# Patient Record
Sex: Female | Born: 1970 | Race: White | Hispanic: No | Marital: Married | Smoking: Current every day smoker
Health system: Southern US, Community
[De-identification: ages and names within clinical notes are randomized; demographics above are authoritative.]

## PROBLEM LIST (undated history)

## (undated) DIAGNOSIS — K760 Fatty (change of) liver, not elsewhere classified: Secondary | ICD-10-CM

## (undated) DIAGNOSIS — E119 Type 2 diabetes mellitus without complications: Secondary | ICD-10-CM

## (undated) DIAGNOSIS — E669 Obesity, unspecified: Secondary | ICD-10-CM

## (undated) DIAGNOSIS — Z720 Tobacco use: Secondary | ICD-10-CM

## (undated) DIAGNOSIS — N12 Tubulo-interstitial nephritis, not specified as acute or chronic: Secondary | ICD-10-CM

## (undated) HISTORY — PX: JOINT REPLACEMENT: SHX530

## (undated) HISTORY — PX: CHOLECYSTECTOMY: SHX55

---

## 2016-01-15 ENCOUNTER — Encounter (HOSPITAL_BASED_OUTPATIENT_CLINIC_OR_DEPARTMENT_OTHER): Payer: Self-pay

## 2016-01-15 DIAGNOSIS — L02415 Cutaneous abscess of right lower limb: Secondary | ICD-10-CM | POA: Diagnosis present

## 2016-01-15 DIAGNOSIS — R002 Palpitations: Secondary | ICD-10-CM | POA: Diagnosis not present

## 2016-01-15 DIAGNOSIS — E119 Type 2 diabetes mellitus without complications: Secondary | ICD-10-CM | POA: Diagnosis not present

## 2016-01-15 NOTE — ED Triage Notes (Signed)
Pt has what looks like an abscess to the inner right thigh, although she insists it is a spider bite despite never seeing a spider or feeling a bite

## 2016-01-16 ENCOUNTER — Emergency Department (HOSPITAL_BASED_OUTPATIENT_CLINIC_OR_DEPARTMENT_OTHER)
Admission: EM | Admit: 2016-01-16 | Discharge: 2016-01-16 | Disposition: A | Discharge status: Home / Self Care | Payer: Managed Care, Other (non HMO) | Attending: Emergency Medicine | Admitting: Emergency Medicine

## 2016-01-16 DIAGNOSIS — L02415 Cutaneous abscess of right lower limb: Secondary | ICD-10-CM | POA: Diagnosis not present

## 2016-01-16 DIAGNOSIS — R002 Palpitations: Secondary | ICD-10-CM

## 2016-01-16 HISTORY — DX: Type 2 diabetes mellitus without complications: E11.9

## 2016-01-16 LAB — PREGNANCY, URINE: Preg Test, Ur: NEGATIVE

## 2016-01-16 MED ORDER — DOXYCYCLINE HYCLATE 100 MG PO CAPS: 100.0000 mg | ORAL_CAPSULE | Freq: Two times a day (BID) | ORAL | 0 refills | Status: DC

## 2016-01-16 MED ORDER — DOXYCYCLINE HYCLATE 100 MG PO TABS
100.0000 mg | ORAL_TABLET | Freq: Once | ORAL | Status: AC
  Administered 2016-01-16: 100 mg via ORAL
  Filled 2016-01-16: qty 1

## 2016-01-16 NOTE — ED Provider Notes (Signed)
[facility] Provider Note: Lowella Dell, MD, FACEP  CSN: [id number] MRN: [id number] ARRIVAL: 01/15/16 at 2346   CHIEF COMPLAINT  Abscess   HISTORY OF PRESENT ILLNESS  Natalie Brooks is a 45 y.o. female with a tender, erythematous lesion to the right medial thigh that began about 3 days ago. She believes it to be a spider bite but never saw a spider nor did she feel a bite. There has not been drainage from it. There is mild-to-moderate tenderness.  She has also been having palpitations by which she means a rapid pounding heartbeat. Her current episode began about 5 days ago after an argument with her husband. It is been constant and persistent. She denies chest pain or shortness of breath.   Past Medical History:  Diagnosis Date  . Diabetes mellitus without complication [facility])     Past Surgical History:  Procedure Laterality Date  . CHOLECYSTECTOMY    . JOINT REPLACEMENT      No family history on file.  Social History  Substance Use Topics  . Smoking status: Not on file  . Smokeless tobacco: Not on file  . Alcohol use Not on file    Prior to Admission medications   Not on File    Allergies Penicillins   REVIEW OF SYSTEMS  Negative except as noted here or in the History of Present Illness.   PHYSICAL EXAMINATION  Initial Vital Signs Blood pressure 127/81, pulse 88, temperature 98.1 F (36.7 C), temperature source Oral, resp. rate 18, height 6' (1.829 m), weight 240 lb (108.9 kg), last menstrual period 11/11/2015, SpO2 97 %.  Examination General: Well-developed, well-nourished female in no acute distress; appearance consistent with age of record HENT: normocephalic; atraumatic Eyes: pupils equal, round and reactive to light; extraocular muscles intact Neck: supple Heart: regular rate and rhythm; no murmur Lungs: clear to auscultation bilaterally Abdomen: soft; nondistended; nontender; bowel sounds present Extremities: No deformity; full  range of motion; pulses normal Neurologic: Awake, alert and oriented; motor function intact in all extremities and symmetric; no facial droop Skin: Warm and dry; tender erythematous lesion right medial thigh consistent with early abscess, no pus pocket seen on bedside ultrasound Psychiatric: Normal mood and affect   RESULTS  Summary of this visit's results, reviewed by myself:   EKG Interpretation  Date/Time:  Friday January 16 2016 03:21:02 EDT Ventricular Rate:  69 PR Interval:    QRS Duration: 96 QT Interval:  438 QTC Calculation: 470 R Axis:   10 Text Interpretation:  Sinus rhythm Low voltage, precordial leads Artifact in V2 No previous ECGs available Confirmed by Kris Burd  MD, Jonny Ruiz ([id number]) on 01/16/2016 3:25:51 AM      Laboratory Studies: Results for orders placed or performed during the hospital encounter of 01/16/16 (from the past 24 hour(s))  Pregnancy, urine     Status: None   Collection Time: 01/16/16  4:13 AM  Result Value Ref Range   Preg Test, Ur NEGATIVE NEGATIVE   Imaging Studies: No results found.  ED COURSE  Nursing notes and initial vitals signs, including pulse oximetry, reviewed.  Vitals:   01/15/16 2355  BP: 127/81  Pulse: 88  Resp: 18  Temp: 98.1 F (36.7 C)  TempSrc: Oral  SpO2: 97%  Weight: 240 lb (108.9 kg)  Height: 6' (1.829 m)   Lesion appears to be an abscess but there is no pus pocket palpable or seen bedside ultrasound. We will treat with antibiotics. Despite the patient's complaint of the sensation of  a rapid pounding heartbeat her rhythm is normal sinus with an unremarkable EKG.  PROCEDURES    ED DIAGNOSES     ICD-9-CM ICD-10-CM   1. Abscess of right thigh 682.6 L02.415        Paula LibraJohn Robertta Halfhill, MD 01/16/16 [id number]

## 2016-02-14 ENCOUNTER — Emergency Department (HOSPITAL_BASED_OUTPATIENT_CLINIC_OR_DEPARTMENT_OTHER): Payer: Managed Care, Other (non HMO)

## 2016-02-14 ENCOUNTER — Emergency Department (HOSPITAL_BASED_OUTPATIENT_CLINIC_OR_DEPARTMENT_OTHER)
Admission: EM | Admit: 2016-02-14 | Discharge: 2016-02-14 | Disposition: A | Discharge status: Home / Self Care | Payer: Managed Care, Other (non HMO) | Attending: Emergency Medicine | Admitting: Emergency Medicine

## 2016-02-14 ENCOUNTER — Encounter (HOSPITAL_BASED_OUTPATIENT_CLINIC_OR_DEPARTMENT_OTHER): Payer: Self-pay | Admitting: Emergency Medicine

## 2016-02-14 DIAGNOSIS — R0789 Other chest pain: Secondary | ICD-10-CM | POA: Diagnosis not present

## 2016-02-14 DIAGNOSIS — Z9660 Presence of unspecified orthopedic joint implant: Secondary | ICD-10-CM | POA: Insufficient documentation

## 2016-02-14 DIAGNOSIS — R072 Precordial pain: Secondary | ICD-10-CM | POA: Diagnosis present

## 2016-02-14 DIAGNOSIS — F17210 Nicotine dependence, cigarettes, uncomplicated: Secondary | ICD-10-CM | POA: Insufficient documentation

## 2016-02-14 DIAGNOSIS — E119 Type 2 diabetes mellitus without complications: Secondary | ICD-10-CM | POA: Diagnosis not present

## 2016-02-14 LAB — CBC WITH DIFFERENTIAL/PLATELET
BASOS PCT: 0 %
Basophils Absolute: 0 10*3/uL (ref 0.0–0.1)
EOS ABS: 0.1 10*3/uL (ref 0.0–0.7)
Eosinophils Relative: 1 %
HEMATOCRIT: 39.3 % (ref 36.0–46.0)
HEMOGLOBIN: 13.3 g/dL (ref 12.0–15.0)
LYMPHS ABS: 2.9 10*3/uL (ref 0.7–4.0)
Lymphocytes Relative: 37 %
MCH: 30.6 pg (ref 26.0–34.0)
MCHC: 33.8 g/dL (ref 30.0–36.0)
MCV: 90.6 fL (ref 78.0–100.0)
MONOS PCT: 7 %
Monocytes Absolute: 0.6 10*3/uL (ref 0.1–1.0)
NEUTROS ABS: 4.3 10*3/uL (ref 1.7–7.7)
NEUTROS PCT: 55 %
Platelets: 231 10*3/uL (ref 150–400)
RBC: 4.34 MIL/uL (ref 3.87–5.11)
RDW: 13.2 % (ref 11.5–15.5)
WBC: 7.8 10*3/uL (ref 4.0–10.5)

## 2016-02-14 LAB — BASIC METABOLIC PANEL
Anion gap: 7 (ref 5–15)
BUN: 19 mg/dL (ref 6–20)
CHLORIDE: 103 mmol/L (ref 101–111)
CO2: 26 mmol/L (ref 22–32)
Calcium: 8.7 mg/dL — ABNORMAL LOW (ref 8.9–10.3)
Creatinine, Ser: 0.68 mg/dL (ref 0.44–1.00)
GFR calc non Af Amer: >60 mL/min (ref >60)
Glucose, Bld: 300 mg/dL — ABNORMAL HIGH (ref 65–99)
POTASSIUM: 3.7 mmol/L (ref 3.5–5.1)
SODIUM: 136 mmol/L (ref 135–145)

## 2016-02-14 LAB — D-DIMER, QUANTITATIVE: D-Dimer, Quant: <0.27 ug/mL-FEU (ref 0.00–0.50)

## 2016-02-14 LAB — TROPONIN I: Troponin I: <0.03 ng/mL (ref <0.03)

## 2016-02-14 MED ORDER — KETOROLAC TROMETHAMINE 15 MG/ML IJ SOLN
15.0000 mg | Freq: Once | INTRAMUSCULAR | Status: AC
  Administered 2016-02-14: 15 mg via INTRAVENOUS
  Filled 2016-02-14: qty 1

## 2016-02-14 MED ORDER — ASPIRIN 81 MG PO CHEW
324.0000 mg | CHEWABLE_TABLET | Freq: Once | ORAL | Status: AC
  Administered 2016-02-14: 324 mg via ORAL
  Filled 2016-02-14: qty 4

## 2016-02-14 NOTE — ED Triage Notes (Signed)
Patient states she woke up suddenly at 0115 and states she had pain to her central chest, radiating to the underside of her left breast, also having pain to her left shoulder and down into her hand. Patient states she has never ad this pain before. Patient reports she took two 200mg  Advil at @0120  and has not had any relief. Patient states at this time the pain is "not as bad as it was". Patient describes the pain in her arm as throbbing, and the chest pain is "stabbing" in nature. Patient denies any injuries or recollection of events that could have contributed to her pain. Patient is awake, alert, and NAD noted.

## 2016-02-14 NOTE — ED Provider Notes (Signed)
[facility] Provider Note: Lowella DellJ. Lane Lachanda Buczek, MD, FACEP  CSN: [id number] MRN: [id number] ARRIVAL: 02/14/16 at 0303 ROOM: [facility]01/[facility]01   CHIEF COMPLAINT  Chest Pain   HISTORY OF PRESENT ILLNESS  Natalie Brooks is a 45 y.o. female with history of diabetes. She is here with chest pain that awakened her from sleep about 1:15 this morning. She describes the pain is sharp, well localized and located to the left of the upper sternum. The pain is worse with deep breathing or with palpation. She is also having pain down her left arm which she describes is on the "inside" of the arm (about the T1 dermatome) with tingling in her fingers; this pain does not change with deep breathing or palpation of the chest, Nor does it change with movement of her neck. She denies shortness of breath, diaphoresis or nausea. She has had chronic mild edema of lower legs for several months which has not acutely changed.    Past Medical History:  Diagnosis Date  . Diabetes mellitus without complication [facility](HCC)     Past Surgical History:  Procedure Laterality Date  . CHOLECYSTECTOMY    . JOINT REPLACEMENT      History reviewed. No pertinent family history.  Social History  Substance Use Topics  . Smoking status: Current Every Day Smoker    Packs/day: 1.00    Types: Cigarettes  . Smokeless tobacco: Never Used  . Alcohol use No    Prior to Admission medications   Not on File    Allergies Penicillins   REVIEW OF SYSTEMS  Negative except as noted here or in the History of Present Illness.   PHYSICAL EXAMINATION  Initial Vital Signs Blood pressure 130/63, pulse 76, temperature 97.6 F (36.4 C), temperature source Oral, resp. rate 13, height 6' (1.829 m), weight 240 lb (108.9 kg), last menstrual period 11/11/2015, SpO2 97 %.  Examination General: Well-developed, well-nourished female in no acute distress; appearance consistent with age of record HENT: normocephalic; atraumatic Eyes:  pupils equal, round and reactive to light; extraocular muscles intact Neck: supple Heart: regular rate and rhythm; no murmur; occasional PACs Lungs: clear to auscultation bilaterally Chest: Point tenderness to the left of the upper sternum which reproduces the pain of the chief complaint Abdomen: soft; nondistended; nontender; no masses or hepatosplenomegaly; bowel sounds present Extremities: No deformity; full range of motion; pulses normal; trace edema of lower legs; no calf tenderness Neurologic: Awake, alert and oriented; motor function intact in all extremities and symmetric; no facial droop Skin: Warm and dry Psychiatric: Anxious; tearful; having difficulty answering simple questions such as "how long of your legs been swollen?", deferring to her husband to speak in her place   RESULTS  Summary of this visit's results, reviewed by myself:   EKG Interpretation  Date/Time:  Saturday February 14 2016 03:10:24 EST Ventricular Rate:  77 PR Interval:    QRS Duration: 93 QT Interval:  398 QTC Calculation: 451 R Axis:   8 Text Interpretation:  Sinus rhythm Atrial premature complex PACs not seen previously, otherwise No significant change was found Confirmed by Tudor Chandley  MD, Jonny RuizJOHN ([id number]) on 02/14/2016 3:14:59 AM      Laboratory Studies: Results for orders placed or performed during the hospital encounter of 02/14/16 (from the past 24 hour(s))  [facility], quantitative (not [facility])     Status: None   Collection Time: 02/14/16  3:40 AM  Result Value Ref Range   D-Dimer, Quant <0.27 0.00 - 0.50 ug/mL-FEU  Troponin I  Status: None   Collection Time: 02/14/16  3:40 AM  Result Value Ref Range   Troponin I <0.03 <0.03 ng/mL  CBC with Differential/Platelet     Status: None   Collection Time: 02/14/16  3:40 AM  Result Value Ref Range   WBC 7.8 4.0 - 10.5 K/uL   RBC 4.34 3.87 - 5.11 MIL/uL   Hemoglobin 13.3 12.0 - 15.0 g/dL   HCT 74.239.3 59.536.0 - 63.846.0 %   MCV 90.6 78.0 - 100.0 fL   MCH  30.6 26.0 - 34.0 pg   MCHC 33.8 30.0 - 36.0 g/dL   RDW 75.613.2 43.311.5 - 29.515.5 %   Platelets 231 150 - 400 K/uL   Neutrophils Relative % 55 %   Neutro Abs 4.3 1.7 - 7.7 K/uL   Lymphocytes Relative 37 %   Lymphs Abs 2.9 0.7 - 4.0 K/uL   Monocytes Relative 7 %   Monocytes Absolute 0.6 0.1 - 1.0 K/uL   Eosinophils Relative 1 %   Eosinophils Absolute 0.1 0.0 - 0.7 K/uL   Basophils Relative 0 %   Basophils Absolute 0.0 0.0 - 0.1 K/uL  Basic metabolic panel     Status: Abnormal   Collection Time: 02/14/16  3:40 AM  Result Value Ref Range   Sodium 136 135 - 145 mmol/L   Potassium 3.7 3.5 - 5.1 mmol/L   Chloride 103 101 - 111 mmol/L   CO2 26 22 - 32 mmol/L   Glucose, Bld 300 (H) 65 - 99 mg/dL   BUN 19 6 - 20 mg/dL   Creatinine, Ser 1.880.68 0.44 - 1.00 mg/dL   Calcium 8.7 (L) 8.9 - 10.3 mg/dL   GFR calc non Af Amer >60 >60 mL/min   GFR calc Af Amer >60 >60 mL/min   Anion gap 7 5 - 15   Imaging Studies: Dg Chest 2 View  Result Date: 02/14/2016 CLINICAL DATA:  Chest pressure and shortness of breath EXAM: CHEST  2 VIEW COMPARISON:  None. FINDINGS: Cardiomediastinal contours are normal. No pneumothorax or pleural effusion. No focal airspace consolidation or pulmonary edema. IMPRESSION: Clear lungs. Electronically Signed   By: Deatra RobinsonKevin  Herman M.D.   On: 02/14/2016 04:46    ED COURSE  Nursing notes and initial vitals signs, including pulse oximetry, reviewed.  Vitals:   02/14/16 0320 02/14/16 0353 02/14/16 0400 02/14/16 0431  BP:  113/65 115/64 116/65  Pulse:  72 81 73  Resp:  16 19 (!) 9  Temp:      TempSrc:      SpO2:  98% 95% 97%  Weight: 240 lb (108.9 kg)     Height: 6' (1.829 m)      5:15 AM Chest and arm pain resolved after Toradol IV. Her symptoms and exam are not consistent with cardiac heart pain. Her d-dimer is negative; PERC is <2%, Well's is 0. She was advised to treat her chest wall pain with Aleve, and to return for worsening or changing symptoms.  PROCEDURES    ED  DIAGNOSES     ICD-9-CM ICD-10-CM   1. Chest wall pain 786.52 R07.89        Paula LibraJohn Izeah Vossler, MD 02/14/16 [phone]

## 2016-02-14 NOTE — ED Notes (Signed)
ED Provider at bedside. 

## 2016-02-14 NOTE — ED Notes (Addendum)
Patient now reports that for the past couple of months she has had swelling and pain to her bilateral calves. Patient states this is not something that has changed in the past day or two. Patient is tearful. Patient states the pain in her chest is similiar to that of the pain when pressure is applied to her chest. Patient states applying pressure to her chest does not worsen her SOB, nor does it worsen the pain in her left arm. Patient also reports to MD that she has stiffness in her neck as well, that is also not new with the chest pain this morning.   Edema noted to BLE, non pitting.

## 2016-02-14 NOTE — ED Notes (Signed)
Patient returned from XR. 

## 2016-05-18 ENCOUNTER — Encounter (HOSPITAL_BASED_OUTPATIENT_CLINIC_OR_DEPARTMENT_OTHER): Payer: Self-pay | Admitting: Emergency Medicine

## 2016-05-18 ENCOUNTER — Emergency Department (HOSPITAL_BASED_OUTPATIENT_CLINIC_OR_DEPARTMENT_OTHER): Payer: Managed Care, Other (non HMO)

## 2016-05-18 ENCOUNTER — Emergency Department (HOSPITAL_BASED_OUTPATIENT_CLINIC_OR_DEPARTMENT_OTHER)
Admission: EM | Admit: 2016-05-18 | Discharge: 2016-05-18 | Disposition: A | Discharge status: Home / Self Care | Payer: Managed Care, Other (non HMO) | Attending: Emergency Medicine | Admitting: Emergency Medicine

## 2016-05-18 DIAGNOSIS — K760 Fatty (change of) liver, not elsewhere classified: Secondary | ICD-10-CM | POA: Insufficient documentation

## 2016-05-18 DIAGNOSIS — N3000 Acute cystitis without hematuria: Secondary | ICD-10-CM | POA: Insufficient documentation

## 2016-05-18 DIAGNOSIS — F17210 Nicotine dependence, cigarettes, uncomplicated: Secondary | ICD-10-CM | POA: Diagnosis not present

## 2016-05-18 DIAGNOSIS — E119 Type 2 diabetes mellitus without complications: Secondary | ICD-10-CM | POA: Insufficient documentation

## 2016-05-18 DIAGNOSIS — R1011 Right upper quadrant pain: Secondary | ICD-10-CM | POA: Diagnosis present

## 2016-05-18 DIAGNOSIS — R109 Unspecified abdominal pain: Secondary | ICD-10-CM

## 2016-05-18 LAB — CBC
HEMATOCRIT: 42.9 % (ref 36.0–46.0)
HEMOGLOBIN: 14.5 g/dL (ref 12.0–15.0)
MCH: 30.3 pg (ref 26.0–34.0)
MCHC: 33.8 g/dL (ref 30.0–36.0)
MCV: 89.6 fL (ref 78.0–100.0)
Platelets: 243 10*3/uL (ref 150–400)
RBC: 4.79 MIL/uL (ref 3.87–5.11)
RDW: 12.7 % (ref 11.5–15.5)
WBC: 7.8 10*3/uL (ref 4.0–10.5)

## 2016-05-18 LAB — HEPATIC FUNCTION PANEL
ALBUMIN: 3.8 g/dL (ref 3.5–5.0)
ALK PHOS: 77 U/L (ref 38–126)
ALT: 24 U/L (ref 14–54)
AST: 19 U/L (ref 15–41)
BILIRUBIN TOTAL: 0.5 mg/dL (ref 0.3–1.2)
Total Protein: 7.3 g/dL (ref 6.5–8.1)

## 2016-05-18 LAB — BASIC METABOLIC PANEL
Anion gap: 8 (ref 5–15)
BUN: 15 mg/dL (ref 6–20)
CHLORIDE: 105 mmol/L (ref 101–111)
CO2: 23 mmol/L (ref 22–32)
Calcium: 8.9 mg/dL (ref 8.9–10.3)
Creatinine, Ser: 0.88 mg/dL (ref 0.44–1.00)
GFR calc Af Amer: >60 mL/min (ref >60)
Glucose, Bld: 334 mg/dL — ABNORMAL HIGH (ref 65–99)
POTASSIUM: 3.8 mmol/L (ref 3.5–5.1)
SODIUM: 136 mmol/L (ref 135–145)

## 2016-05-18 LAB — TROPONIN I: Troponin I: <0.03 ng/mL (ref <0.03)

## 2016-05-18 LAB — URINALYSIS, MICROSCOPIC (REFLEX)

## 2016-05-18 LAB — URINALYSIS, ROUTINE W REFLEX MICROSCOPIC
BILIRUBIN URINE: NEGATIVE
Glucose, UA: >=500 mg/dL — AB
HGB URINE DIPSTICK: NEGATIVE
Ketones, ur: NEGATIVE mg/dL
Leukocytes, UA: NEGATIVE
NITRITE: NEGATIVE
Protein, ur: NEGATIVE mg/dL
SPECIFIC GRAVITY, URINE: 1.04 — AB (ref 1.005–1.030)
pH: 5 (ref 5.0–8.0)

## 2016-05-18 LAB — LIPASE, BLOOD: LIPASE: 23 U/L (ref 11–51)

## 2016-05-18 LAB — PREGNANCY, URINE: PREG TEST UR: NEGATIVE

## 2016-05-18 MED ORDER — OXYCODONE HCL 5 MG PO TABS: 5.0000 mg | ORAL_TABLET | ORAL | 0 refills | Status: DC | PRN

## 2016-05-18 MED ORDER — LEVOFLOXACIN 750 MG PO TABS: 750.0000 mg | ORAL_TABLET | Freq: Every day | ORAL | 0 refills | Status: DC

## 2016-05-18 MED ORDER — PROMETHAZINE HCL 25 MG PO TABS: 25.0000 mg | ORAL_TABLET | Freq: Four times a day (QID) | ORAL | 0 refills | Status: DC | PRN

## 2016-05-18 MED ORDER — KETOROLAC TROMETHAMINE 15 MG/ML IJ SOLN
15.0000 mg | Freq: Once | INTRAMUSCULAR | Status: AC
  Administered 2016-05-18: 15 mg via INTRAVENOUS
  Filled 2016-05-18: qty 1

## 2016-05-18 MED ORDER — IOPAMIDOL (ISOVUE-300) INJECTION 61%
100.0000 mL | Freq: Once | INTRAVENOUS | Status: AC | PRN
  Administered 2016-05-18: 100 mL via INTRAVENOUS

## 2016-05-18 MED ORDER — MORPHINE SULFATE (PF) 4 MG/ML IV SOLN
4.0000 mg | Freq: Once | INTRAVENOUS | Status: AC
  Administered 2016-05-18: 4 mg via INTRAVENOUS
  Filled 2016-05-18: qty 1

## 2016-05-18 MED ORDER — HYDROMORPHONE HCL 1 MG/ML IJ SOLN
1.0000 mg | Freq: Once | INTRAMUSCULAR | Status: AC
  Administered 2016-05-18: 1 mg via INTRAVENOUS
  Filled 2016-05-18: qty 1

## 2016-05-18 MED ORDER — SODIUM CHLORIDE 0.9 % IV BOLUS (SEPSIS)
1000.0000 mL | Freq: Once | INTRAVENOUS | Status: AC
  Administered 2016-05-18: 1000 mL via INTRAVENOUS

## 2016-05-18 MED ORDER — ONDANSETRON HCL 4 MG/2ML IJ SOLN
4.0000 mg | Freq: Once | INTRAMUSCULAR | Status: AC
  Administered 2016-05-18: 4 mg via INTRAVENOUS
  Filled 2016-05-18: qty 2

## 2016-05-18 NOTE — ED Triage Notes (Signed)
Pt c/o pain under R rib cage and RUQ of abd. Denies fever, n/v/d, or other symptoms. Rates pain 7/10.

## 2016-05-18 NOTE — ED Provider Notes (Signed)
[facility] Note   CSN: [id number] Arrival date & time: 05/18/16  0522     History   Chief Complaint Chief Complaint  Patient presents with  . Chest Pain    HPI Natalie Brooks is a 46 y.o. female.  HPI 46 year old female with history of morbid obesity presents with right flank pain. The patient states over the last several weeks, she has had initially intermittent and now constant right-sided upper abdominal pain. The pain is an aching, throbbing, cramping sensation that is worse with movement and palpation. It is also mildly worse with eating. She's been taking ibuprofen for this with minimal improvement. She has a history of gallbladder surgery and states her pain was similar to this. No cough or sputum production. Denies any shortness of breath. No nausea or diaphoresis.  Past Medical History:  Diagnosis Date  . Diabetes mellitus without complication (HCC)     There are no active problems to display for this patient.   Past Surgical History:  Procedure Laterality Date  . CHOLECYSTECTOMY    . JOINT REPLACEMENT      OB History    No data available       Home Medications    Prior to Admission medications   Medication Sig Start Date End Date Taking? Authorizing Provider  levofloxacin (LEVAQUIN) 750 MG tablet Take 1 tablet (750 mg total) by mouth daily. 05/18/16 05/25/16  Shaune Pollack, MD  metFORMIN (GLUCOPHAGE) 500 MG tablet Take 500 mg by mouth 2 (two) times daily with a meal.    Historical Provider, MD  oxyCODONE (ROXICODONE) 5 MG immediate release tablet Take 1 tablet (5 mg total) by mouth every 4 (four) hours as needed for severe pain. 05/18/16   Shaune Pollack, MD  promethazine (PHENERGAN) 25 MG tablet Take 1 tablet (25 mg total) by mouth every 6 (six) hours as needed for nausea or vomiting. 05/18/16   Shaune Pollack, MD    Family History No family history on file.  Social History Social History  Substance Use Topics  . Smoking  status: Current Every Day Smoker    Packs/day: 1.00    Types: Cigarettes  . Smokeless tobacco: Never Used  . Alcohol use No     Allergies   Penicillins   Review of Systems Review of Systems  Constitutional: Negative for chills, fatigue and fever.  HENT: Negative for congestion and rhinorrhea.   Eyes: Negative for visual disturbance.  Respiratory: Negative for cough, shortness of breath and wheezing.   Cardiovascular: Negative for chest pain and leg swelling.  Gastrointestinal: Positive for abdominal pain and nausea. Negative for diarrhea and vomiting.  Genitourinary: Negative for dysuria, flank pain, hematuria, vaginal bleeding and vaginal discharge.  Musculoskeletal: Negative for neck pain and neck stiffness.  Skin: Negative for rash and wound.  Allergic/Immunologic: Negative for immunocompromised state.  Neurological: Negative for syncope, weakness and headaches.  All other systems reviewed and are negative.    Physical Exam Updated Vital Signs BP 117/57 (BP Location: Right Arm)   Pulse 70   Temp 97.9 F (36.6 C) (Oral)   Resp 16   Ht 5\' 11"  (1.803 m)   Wt 240 lb (108.9 kg)   LMP 02/11/2016   SpO2 100%   BMI 33.47 kg/m   Physical Exam  Constitutional: She is oriented to person, place, and time. She appears well-developed and well-nourished. No distress.  HENT:  Head: Normocephalic and atraumatic.  Eyes: Conjunctivae are normal.  Neck: Neck supple.  Cardiovascular: Normal rate,  regular rhythm and normal heart sounds.  Exam reveals no friction rub.   No murmur heard. Pulmonary/Chest: Effort normal and breath sounds normal. No respiratory distress. She has no wheezes. She has no rales.  Abdominal: Soft. Normal appearance. She exhibits no distension. There is no rigidity, no rebound, no guarding and no CVA tenderness.    Musculoskeletal: She exhibits no edema.  Neurological: She is alert and oriented to person, place, and time. She exhibits normal muscle tone.    Skin: Skin is warm. Capillary refill takes less than 2 seconds.  Psychiatric: She has a normal mood and affect.  Nursing note and vitals reviewed.    ED Treatments / Results  Labs (all labs ordered are listed, but only abnormal results are displayed) Labs Reviewed  URINALYSIS, ROUTINE W REFLEX MICROSCOPIC - Abnormal; Notable for the following:       Result Value   Specific Gravity, Urine 1.040 (*)    Glucose, UA >=500 (*)    All other components within normal limits  BASIC METABOLIC PANEL - Abnormal; Notable for the following:    Glucose, Bld 334 (*)    All other components within normal limits  URINALYSIS, MICROSCOPIC (REFLEX) - Abnormal; Notable for the following:    Bacteria, UA MANY (*)    Squamous Epithelial / LPF 0-5 (*)    All other components within normal limits  HEPATIC FUNCTION PANEL - Abnormal; Notable for the following:    Bilirubin, Direct <0.1 (*)    All other components within normal limits  URINE CULTURE  PREGNANCY, URINE  CBC  TROPONIN I  LIPASE, BLOOD  TROPONIN I    EKG  EKG Interpretation  Date/Time:  Tuesday May 18 2016 06:03:49 EST Ventricular Rate:  83 PR Interval:    QRS Duration: 93 QT Interval:  384 QTC Calculation: 452 R Axis:   1 Text Interpretation:  Sinus rhythm Probable left atrial enlargement Low voltage, precordial leads Confirmed by [facility]  MD, Morene Antu ([id number]) on 05/18/2016 6:12:38 AM Also confirmed by [facility]  MD, APRIL ([id number]), editor [place]  CCT, BEVERLY ([id number])  on 05/18/2016 6:43:24 AM       Radiology Dg Chest 2 View  Result Date: 05/18/2016 CLINICAL DATA:  Chest pain EXAM: CHEST  2 VIEW COMPARISON:  Chest radiograph 02/14/2016 FINDINGS: The lungs are well inflated. Cardiomediastinal contours are normal. No pneumothorax or pleural effusion. No focal airspace consolidation or pulmonary edema. No rib fracture visualized. IMPRESSION: Clear lungs. Electronically Signed   By: Deatra Robinson M.D.   On: 05/18/2016  06:27   Ct Abdomen Pelvis W Contrast  Result Date: 05/18/2016 CLINICAL DATA:  Pain under RIGHT rib cage and RIGHT upper quadrant for months. Worse in the past 2 weeks. EXAM: CT ABDOMEN AND PELVIS WITH CONTRAST TECHNIQUE: Multidetector CT imaging of the abdomen and pelvis was performed using the standard protocol following bolus administration of intravenous contrast. CONTRAST:  ISOVUE-300 IOPAMIDOL (ISOVUE-300) INJECTION 61% COMPARISON:  None. FINDINGS: Lower chest: No acute abnormality. Hepatobiliary: No focal liver abnormality is seen. Steatosis. Status post cholecystectomy. No biliary dilatation. Pancreas: Unremarkable. No pancreatic ductal dilatation or surrounding inflammatory changes. Spleen: Normal in size without focal abnormality. Adrenals/Urinary Tract: Adrenal glands are unremarkable. Kidneys are normal, without renal calculi, focal lesion, or hydronephrosis. Bladder is unremarkable. Stomach/Bowel: Stomach is within normal limits. Appendix appears normal. No evidence of bowel wall thickening, distention, or inflammatory changes. Vascular/Lymphatic: No significant vascular findings are present. No enlarged abdominal or pelvic lymph nodes. Reproductive: Uterus and bilateral adnexa  are unremarkable. Other: No abdominal wall hernia or abnormality. No abdominopelvic ascites. Musculoskeletal: No acute or significant osseous findings. IMPRESSION: Hepatic steatosis. Status post cholecystectomy. No acute abnormalities seen to explain the patient's reported symptoms. Electronically Signed   By: Elsie StainJohn T Curnes M.D.   On: 05/18/2016 08:31    Procedures Procedures (including critical care time)  Medications Ordered in ED Medications  sodium chloride 0.9 % bolus 1,000 mL (0 mLs Intravenous Stopped 05/18/16 1005)  HYDROmorphone (DILAUDID) injection 1 mg (1 mg Intravenous Given 05/18/16 0807)  ondansetron (ZOFRAN) injection 4 mg (4 mg Intravenous Given 05/18/16 0807)  iopamidol (ISOVUE-300) 61 %  injection 100 mL (100 mLs Intravenous Contrast Given 05/18/16 0750)  ketorolac (TORADOL) 15 MG/ML injection 15 mg (15 mg Intravenous Given 05/18/16 1016)  morphine 4 MG/ML injection 4 mg (4 mg Intravenous Given 05/18/16 1024)     Initial Impression / Assessment and Plan / ED Course  I have reviewed the triage vital signs and the nursing notes.  Pertinent labs & imaging results that were available during my care of the patient were reviewed by me and considered in my medical decision making (see chart for details).     46 year old female who presents with a 2 week history of progressively worsening right upper quadrant pain. She is status post cholecystectomy. On arrival, vital signs are stable. Examination is as above. Etiology of flank pain is unclear at this time. Differential includes musculoskeletal pain, also possible hepatitis secondary to fatty liver, versus retained stone or sludge. Given undifferentiated nature and obesity limiting exam, will obtain CT scan. Otherwise, will obtain screening lab work.  Labwork reviewed and unremarkable. CBC without leukocytosis. BMP is at baseline. LFTs are also normal. Lipase normal. Troponin negative 2. CT scan subsequently obtained and does show fatty liver but no evidence of other acute abnormality and symptoms are improved with analgesia here. Suspect pain secondary to fatty liver disease versus muscular skeletal pain. Her urinalysis does show evidence of pyuria. She does not, however, have any CVA tenderness and I do not suspect pyelonephritis. Will treat her pain, as well as give brief course of Keflex for possible UTI, and discharge home.  Final Clinical Impressions(s) / ED Diagnoses   Final diagnoses:  Flank pain  Fatty liver  Acute cystitis without hematuria    New Prescriptions Discharge Medication List as of 05/18/2016 10:40 AM    START taking these medications   Details  levofloxacin (LEVAQUIN) 750 MG tablet Take 1 tablet (750 mg  total) by mouth daily., Starting Tue 05/18/2016, Until Tue 05/25/2016, Print    oxyCODONE (ROXICODONE) 5 MG immediate release tablet Take 1 tablet (5 mg total) by mouth every 4 (four) hours as needed for severe pain., Starting Tue 05/18/2016, Print    promethazine (PHENERGAN) 25 MG tablet Take 1 tablet (25 mg total) by mouth every 6 (six) hours as needed for nausea or vomiting., Starting Tue 05/18/2016, Print         Shaune Pollackameron Vernice Bowker, MD 05/18/16 [phone]

## 2016-05-20 LAB — URINE CULTURE: Culture: >=100000 — AB

## 2016-05-21 ENCOUNTER — Telehealth (HOSPITAL_BASED_OUTPATIENT_CLINIC_OR_DEPARTMENT_OTHER): Payer: Self-pay

## 2016-05-21 NOTE — Telephone Encounter (Signed)
Post ED Visit - Positive Culture Follow-up  Culture report reviewed by antimicrobial stewardship pharmacist:  [x]  Enzo BiNathan Batchelder, Pharm.D. []  Celedonio MiyamotoJeremy Frens, Pharm.D., BCPS []  Garvin FilaMike Maccia, Pharm.D. []  Georgina PillionElizabeth Martin, Pharm.D., BCPS []  [place]-BacaviMinh Pham, [place]., BCPS, AAHIVP []  Estella HuskMichelle Turner, Pharm.D., BCPS, AAHIVP []  Tennis Mustassie Stewart, Pharm.D. []  Sherle Poeob Vincent, [place].  Positive urine culture, >/= 100,000 colonies -> Klebsiella Pneumoniae Treated with Levofloxacin, organism sensitive to the same and no further patient follow-up is required at this time.  Arvid RightClark, Natalie Brooks 05/21/2016, 12:09 PM

## 2016-05-23 ENCOUNTER — Encounter (HOSPITAL_BASED_OUTPATIENT_CLINIC_OR_DEPARTMENT_OTHER): Payer: Self-pay | Admitting: Emergency Medicine

## 2016-05-23 ENCOUNTER — Inpatient Hospital Stay (HOSPITAL_BASED_OUTPATIENT_CLINIC_OR_DEPARTMENT_OTHER)
Admission: EM | Admit: 2016-05-23 | Discharge: 2016-05-26 | DRG: 690 | Disposition: A | Discharge status: Home / Self Care | Payer: Managed Care, Other (non HMO) | Attending: Internal Medicine | Admitting: Internal Medicine

## 2016-05-23 DIAGNOSIS — E669 Obesity, unspecified: Secondary | ICD-10-CM | POA: Diagnosis present

## 2016-05-23 DIAGNOSIS — E119 Type 2 diabetes mellitus without complications: Secondary | ICD-10-CM

## 2016-05-23 DIAGNOSIS — Z7984 Long term (current) use of oral hypoglycemic drugs: Secondary | ICD-10-CM

## 2016-05-23 DIAGNOSIS — N12 Tubulo-interstitial nephritis, not specified as acute or chronic: Secondary | ICD-10-CM | POA: Diagnosis not present

## 2016-05-23 DIAGNOSIS — Z833 Family history of diabetes mellitus: Secondary | ICD-10-CM

## 2016-05-23 DIAGNOSIS — N10 Acute tubulo-interstitial nephritis: Secondary | ICD-10-CM | POA: Diagnosis not present

## 2016-05-23 DIAGNOSIS — Z79899 Other long term (current) drug therapy: Secondary | ICD-10-CM

## 2016-05-23 DIAGNOSIS — R109 Unspecified abdominal pain: Secondary | ICD-10-CM | POA: Diagnosis present

## 2016-05-23 DIAGNOSIS — Z6835 Body mass index (BMI) 35.0-35.9, adult: Secondary | ICD-10-CM

## 2016-05-23 DIAGNOSIS — F17210 Nicotine dependence, cigarettes, uncomplicated: Secondary | ICD-10-CM | POA: Diagnosis present

## 2016-05-23 DIAGNOSIS — Z880 Allergy status to penicillin: Secondary | ICD-10-CM

## 2016-05-23 DIAGNOSIS — R739 Hyperglycemia, unspecified: Secondary | ICD-10-CM

## 2016-05-23 DIAGNOSIS — B961 Klebsiella pneumoniae [K. pneumoniae] as the cause of diseases classified elsewhere: Secondary | ICD-10-CM | POA: Diagnosis present

## 2016-05-23 DIAGNOSIS — K760 Fatty (change of) liver, not elsewhere classified: Secondary | ICD-10-CM | POA: Diagnosis present

## 2016-05-23 DIAGNOSIS — F17200 Nicotine dependence, unspecified, uncomplicated: Secondary | ICD-10-CM | POA: Diagnosis present

## 2016-05-23 DIAGNOSIS — E118 Type 2 diabetes mellitus with unspecified complications: Secondary | ICD-10-CM

## 2016-05-23 DIAGNOSIS — E1165 Type 2 diabetes mellitus with hyperglycemia: Secondary | ICD-10-CM | POA: Diagnosis present

## 2016-05-23 DIAGNOSIS — Z9119 Patient's noncompliance with other medical treatment and regimen: Secondary | ICD-10-CM

## 2016-05-23 HISTORY — DX: Fatty (change of) liver, not elsewhere classified: K76.0

## 2016-05-23 HISTORY — DX: Tubulo-interstitial nephritis, not specified as acute or chronic: N12

## 2016-05-23 HISTORY — DX: Tobacco use: Z72.0

## 2016-05-23 HISTORY — DX: Obesity, unspecified: E66.9

## 2016-05-23 NOTE — ED Triage Notes (Signed)
Pt reports pain in RUQ starting a couple weeks ago.  Was seen here Tuesday and was told that liver was inflamed, having increased pain today. Pt denies v/d, but does have nausea.  Pt alert and oriented.

## 2016-05-24 ENCOUNTER — Encounter (HOSPITAL_COMMUNITY): Payer: Self-pay | Admitting: Internal Medicine

## 2016-05-24 ENCOUNTER — Emergency Department (HOSPITAL_BASED_OUTPATIENT_CLINIC_OR_DEPARTMENT_OTHER): Payer: Managed Care, Other (non HMO)

## 2016-05-24 DIAGNOSIS — N12 Tubulo-interstitial nephritis, not specified as acute or chronic: Secondary | ICD-10-CM | POA: Diagnosis not present

## 2016-05-24 DIAGNOSIS — R1010 Upper abdominal pain, unspecified: Secondary | ICD-10-CM | POA: Diagnosis not present

## 2016-05-24 DIAGNOSIS — E118 Type 2 diabetes mellitus with unspecified complications: Secondary | ICD-10-CM | POA: Diagnosis not present

## 2016-05-24 DIAGNOSIS — K760 Fatty (change of) liver, not elsewhere classified: Secondary | ICD-10-CM | POA: Diagnosis not present

## 2016-05-24 DIAGNOSIS — F17200 Nicotine dependence, unspecified, uncomplicated: Secondary | ICD-10-CM | POA: Diagnosis not present

## 2016-05-24 DIAGNOSIS — R109 Unspecified abdominal pain: Secondary | ICD-10-CM | POA: Diagnosis present

## 2016-05-24 DIAGNOSIS — E669 Obesity, unspecified: Secondary | ICD-10-CM | POA: Diagnosis present

## 2016-05-24 DIAGNOSIS — E119 Type 2 diabetes mellitus without complications: Secondary | ICD-10-CM

## 2016-05-24 LAB — TROPONIN I

## 2016-05-24 LAB — URINALYSIS, MICROSCOPIC (REFLEX)

## 2016-05-24 LAB — URINALYSIS, ROUTINE W REFLEX MICROSCOPIC
Bilirubin Urine: NEGATIVE
Hgb urine dipstick: NEGATIVE
KETONES UR: NEGATIVE mg/dL
LEUKOCYTES UA: NEGATIVE
NITRITE: POSITIVE — AB
PROTEIN: NEGATIVE mg/dL
Specific Gravity, Urine: 1.038 — ABNORMAL HIGH (ref 1.005–1.030)
pH: 5 (ref 5.0–8.0)

## 2016-05-24 LAB — PREGNANCY, URINE: PREG TEST UR: NEGATIVE

## 2016-05-24 LAB — COMPREHENSIVE METABOLIC PANEL
ALT: 33 U/L (ref 14–54)
AST: 19 U/L (ref 15–41)
Albumin: 4 g/dL (ref 3.5–5.0)
Alkaline Phosphatase: 81 U/L (ref 38–126)
Anion gap: 8 (ref 5–15)
BUN: 15 mg/dL (ref 6–20)
CHLORIDE: 102 mmol/L (ref 101–111)
CO2: 26 mmol/L (ref 22–32)
CREATININE: 0.87 mg/dL (ref 0.44–1.00)
Calcium: 9.5 mg/dL (ref 8.9–10.3)
GFR calc non Af Amer: >60 mL/min (ref >60)
Glucose, Bld: 300 mg/dL — ABNORMAL HIGH (ref 65–99)
POTASSIUM: 3.5 mmol/L (ref 3.5–5.1)
SODIUM: 136 mmol/L (ref 135–145)
Total Bilirubin: 0.3 mg/dL (ref 0.3–1.2)
Total Protein: 7.4 g/dL (ref 6.5–8.1)

## 2016-05-24 LAB — CBC WITH DIFFERENTIAL/PLATELET
BASOS ABS: 0 10*3/uL (ref 0.0–0.1)
Basophils Relative: 0 %
EOS ABS: 0.1 10*3/uL (ref 0.0–0.7)
EOS PCT: 1 %
HCT: 42.6 % (ref 36.0–46.0)
Hemoglobin: 14.9 g/dL (ref 12.0–15.0)
Lymphocytes Relative: 41 %
Lymphs Abs: 3.5 10*3/uL (ref 0.7–4.0)
MCH: 31 pg (ref 26.0–34.0)
MCHC: 35 g/dL (ref 30.0–36.0)
MCV: 88.6 fL (ref 78.0–100.0)
MONO ABS: 0.6 10*3/uL (ref 0.1–1.0)
Monocytes Relative: 7 %
Neutro Abs: 4.4 10*3/uL (ref 1.7–7.7)
Neutrophils Relative %: 51 %
PLATELETS: 287 10*3/uL (ref 150–400)
RBC: 4.81 MIL/uL (ref 3.87–5.11)
RDW: 12.9 % (ref 11.5–15.5)
WBC: 8.5 10*3/uL (ref 4.0–10.5)

## 2016-05-24 LAB — GLUCOSE, CAPILLARY
GLUCOSE-CAPILLARY: 294 mg/dL — AB (ref 65–99)
GLUCOSE-CAPILLARY: 293 mg/dL — AB (ref 65–99)
Glucose-Capillary: 192 mg/dL — ABNORMAL HIGH (ref 65–99)
Glucose-Capillary: 202 mg/dL — ABNORMAL HIGH (ref 65–99)

## 2016-05-24 LAB — HIV ANTIBODY (ROUTINE TESTING W REFLEX): HIV SCREEN 4TH GENERATION: NONREACTIVE

## 2016-05-24 LAB — D-DIMER, QUANTITATIVE: D-Dimer, Quant: 0.34 ug/mL-FEU (ref 0.00–0.50)

## 2016-05-24 LAB — LIPASE, BLOOD: Lipase: 24 U/L (ref 11–51)

## 2016-05-24 LAB — CBG MONITORING, ED: Glucose-Capillary: 276 mg/dL — ABNORMAL HIGH (ref 65–99)

## 2016-05-24 MED ORDER — INSULIN ASPART 100 UNIT/ML ~~LOC~~ SOLN
0.0000 [IU] | Freq: Three times a day (TID) | SUBCUTANEOUS | Status: DC
  Administered 2016-05-24 (×2): 8 [IU] via SUBCUTANEOUS
  Administered 2016-05-24: 3 [IU] via SUBCUTANEOUS
  Administered 2016-05-25: 5 [IU] via SUBCUTANEOUS
  Administered 2016-05-25 (×2): 3 [IU] via SUBCUTANEOUS
  Administered 2016-05-26: 5 [IU] via SUBCUTANEOUS

## 2016-05-24 MED ORDER — SODIUM CHLORIDE 0.9 % IV BOLUS (SEPSIS)
1000.0000 mL | Freq: Once | INTRAVENOUS | Status: AC
Start: 2016-05-24 — End: 2016-05-24
  Administered 2016-05-24: 1000 mL via INTRAVENOUS

## 2016-05-24 MED ORDER — ONDANSETRON HCL 4 MG/2ML IJ SOLN
4.0000 mg | Freq: Four times a day (QID) | INTRAMUSCULAR | Status: DC | PRN
  Administered 2016-05-24 – 2016-05-25 (×2): 4 mg via INTRAVENOUS
  Filled 2016-05-24: qty 2

## 2016-05-24 MED ORDER — ACETAMINOPHEN 650 MG RE SUPP: 650.0000 mg | Freq: Four times a day (QID) | RECTAL | Status: DC | PRN

## 2016-05-24 MED ORDER — TRAZODONE HCL 50 MG PO TABS
25.0000 mg | ORAL_TABLET | Freq: Every evening | ORAL | Status: DC | PRN
Start: 2016-05-24 — End: 2016-05-26
  Filled 2016-05-24: qty 1

## 2016-05-24 MED ORDER — INSULIN ASPART 100 UNIT/ML ~~LOC~~ SOLN
0.0000 [IU] | Freq: Every day | SUBCUTANEOUS | Status: DC
  Administered 2016-05-24 – 2016-05-25 (×2): 2 [IU] via SUBCUTANEOUS

## 2016-05-24 MED ORDER — SODIUM CHLORIDE 0.9 % IV BOLUS (SEPSIS)
1000.0000 mL | Freq: Once | INTRAVENOUS | Status: AC
  Administered 2016-05-24: 1000 mL via INTRAVENOUS

## 2016-05-24 MED ORDER — ONDANSETRON HCL 4 MG PO TABS: 4.0000 mg | ORAL_TABLET | Freq: Four times a day (QID) | ORAL | Status: DC | PRN

## 2016-05-24 MED ORDER — ACETAMINOPHEN 325 MG PO TABS
650.0000 mg | ORAL_TABLET | Freq: Four times a day (QID) | ORAL | Status: DC | PRN
  Administered 2016-05-25: 650 mg via ORAL
  Filled 2016-05-24: qty 2

## 2016-05-24 MED ORDER — DEXTROSE 5 % IV SOLN
1.0000 g | Freq: Once | INTRAVENOUS | Status: AC
  Administered 2016-05-24: 1 g via INTRAVENOUS
  Filled 2016-05-24: qty 10

## 2016-05-24 MED ORDER — FENTANYL CITRATE (PF) 100 MCG/2ML IJ SOLN
50.0000 ug | Freq: Once | INTRAMUSCULAR | Status: AC
  Administered 2016-05-24: 50 ug via INTRAVENOUS
  Filled 2016-05-24: qty 2

## 2016-05-24 MED ORDER — HYDROMORPHONE HCL 1 MG/ML IJ SOLN
1.0000 mg | Freq: Once | INTRAMUSCULAR | Status: AC
  Administered 2016-05-24: 1 mg via INTRAVENOUS
  Filled 2016-05-24: qty 1

## 2016-05-24 MED ORDER — ENOXAPARIN SODIUM 60 MG/0.6ML ~~LOC~~ SOLN
60.0000 mg | SUBCUTANEOUS | Status: DC
  Administered 2016-05-25: 60 mg via SUBCUTANEOUS
  Filled 2016-05-24 (×2): qty 0.6

## 2016-05-24 MED ORDER — SODIUM CHLORIDE 0.9 % IV SOLN
INTRAVENOUS | Status: AC
  Administered 2016-05-24 (×2): via INTRAVENOUS

## 2016-05-24 MED ORDER — ONDANSETRON HCL 4 MG/2ML IJ SOLN
4.0000 mg | Freq: Once | INTRAMUSCULAR | Status: AC
  Administered 2016-05-24: 4 mg via INTRAVENOUS
  Filled 2016-05-24: qty 2

## 2016-05-24 MED ORDER — HYDROCODONE-ACETAMINOPHEN 5-325 MG PO TABS
1.0000 | ORAL_TABLET | ORAL | Status: DC | PRN
  Administered 2016-05-24: 2 via ORAL
  Filled 2016-05-24 (×4): qty 2

## 2016-05-24 MED ORDER — DEXTROSE 5 % IV SOLN
1.0000 g | INTRAVENOUS | Status: DC
  Administered 2016-05-25 – 2016-05-26 (×2): 1 g via INTRAVENOUS
  Filled 2016-05-24 (×2): qty 10

## 2016-05-24 MED ORDER — HYDROMORPHONE HCL 2 MG/ML IJ SOLN
0.5000 mg | INTRAMUSCULAR | Status: DC | PRN
Start: 2016-05-24 — End: 2016-05-26
  Administered 2016-05-24 – 2016-05-26 (×8): 0.5 mg via INTRAVENOUS
  Filled 2016-05-24 (×8): qty 1

## 2016-05-24 MED ORDER — INSULIN REGULAR HUMAN 100 UNIT/ML IJ SOLN
5.0000 [IU] | Freq: Once | INTRAMUSCULAR | Status: AC
  Administered 2016-05-24: 5 [IU] via SUBCUTANEOUS
  Filled 2016-05-24: qty 1

## 2016-05-24 MED ORDER — LIVING WELL WITH DIABETES BOOK
Freq: Once | Status: AC
  Administered 2016-05-24: 11:00:00
  Filled 2016-05-24: qty 1

## 2016-05-24 MED ORDER — KETOROLAC TROMETHAMINE 30 MG/ML IJ SOLN
30.0000 mg | Freq: Once | INTRAMUSCULAR | Status: AC
  Administered 2016-05-24: 30 mg via INTRAVENOUS
  Filled 2016-05-24: qty 1

## 2016-05-24 MED ORDER — METOCLOPRAMIDE HCL 5 MG/ML IJ SOLN
5.0000 mg | Freq: Three times a day (TID) | INTRAMUSCULAR | Status: DC
  Administered 2016-05-24 – 2016-05-25 (×4): 5 mg via INTRAVENOUS
  Filled 2016-05-24 (×4): qty 2

## 2016-05-24 MED ORDER — GLUCERNA SHAKE PO LIQD
237.0000 mL | ORAL | Status: DC
  Administered 2016-05-24 – 2016-05-25 (×2): 237 mL via ORAL

## 2016-05-24 NOTE — ED Notes (Signed)
Patient requested pain medication.  Relayed this message to patient's nurse

## 2016-05-24 NOTE — ED Notes (Signed)
Dr. Ward into room.  

## 2016-05-24 NOTE — ED Provider Notes (Signed)
By signing my name below, I, Modena Jansky, attest that this documentation has been prepared under the direction and in the presence of Ryla Cauthon N Abriel Hattery, DO. Electronically Signed: Modena Jansky, Scribe. 05/24/2016. 12:15 AM.  TIME SEEN: 12:15 AM  CHIEF COMPLAINT: Abdominal pain, flank pain  HPI:  HPI Comments: Natalie Brooks is a 46 y.o. female who presents to the Emergency Department complaining of intermittent moderate right flank pain and right upper quadrant abdominal pain that started about 2 weeks ago. She was seen in the ED on 05/18/16 for the same complaint, and started on Levaquin for a UTI. She is still on her Levaquin course. Her pain worsened in the past 5 days, which is why she returned to the ED. No modifying factors. She describes her pain as dull with intermittent sharp "spikes". Her pain radiates to her abdomen. She reports associated nausea. She admits to a hx of cholecystectomy. She denies any fever, vomiting, blood in stool, melena, left sided chest pain or SOB. She denies dysuria, vaginal bleeding or discharge.  Status post cholecystectomy. No other abdominal surgeries. Has had pyelonephritis in 1985. No history of kidney stone.   ROS: See HPI Constitutional: no fever  Eyes: no drainage  ENT: no runny nose   Cardiovascular:  no chest pain  Resp: no SOB  GI: no vomiting +nausea GU: +flank pain; no dysuria Integumentary: no rash  Allergy: no hives  Musculoskeletal: no leg swelling  Neurological: no slurred speech ROS otherwise negative  PAST MEDICAL HISTORY/PAST SURGICAL HISTORY:  Past Medical History:  Diagnosis Date  . Diabetes mellitus without complication (HCC)     MEDICATIONS:  Prior to Admission medications   Medication Sig Start Date End Date Taking? Authorizing Provider  levofloxacin (LEVAQUIN) 750 MG tablet Take 1 tablet (750 mg total) by mouth daily. 05/18/16 05/25/16  Shaune Pollack, MD  metFORMIN (GLUCOPHAGE) 500 MG tablet Take 500 mg by mouth 2 (two)  times daily with a meal.    Historical Provider, MD  oxyCODONE (ROXICODONE) 5 MG immediate release tablet Take 1 tablet (5 mg total) by mouth every 4 (four) hours as needed for severe pain. 05/18/16   Shaune Pollack, MD  promethazine (PHENERGAN) 25 MG tablet Take 1 tablet (25 mg total) by mouth every 6 (six) hours as needed for nausea or vomiting. 05/18/16   Shaune Pollack, MD    ALLERGIES:  Allergies  Allergen Reactions  . Penicillins Hives    SOCIAL HISTORY:  Social History  Substance Use Topics  . Smoking status: Current Every Day Smoker    Packs/day: 1.00    Types: Cigarettes  . Smokeless tobacco: Never Used  . Alcohol use No    FAMILY HISTORY: History reviewed. No pertinent family history.  EXAM: BP 126/76 (BP Location: Left Arm)   Pulse 90   Temp 98.2 F (36.8 C) (Oral)   Resp 18   Ht 5\' 11"  (1.803 m)   Wt 240 lb (108.9 kg)   LMP 01/28/2016 (Within Weeks)   SpO2 98%   BMI 33.47 kg/m  CONSTITUTIONAL: Alert and oriented and responds appropriately to questions. Obese, appears uncomfortable, tearful, afebrile and nontoxic HEAD: Normocephalic EYES: Conjunctivae clear, PERRL, EOMI ENT: normal nose; no rhinorrhea; moist mucous membranes NECK: Supple, no meningismus, no nuchal rigidity, no LAD  CARD: RRR; S1 and S2 appreciated; no murmurs, no clicks, no rubs, no gallops RESP: Normal chest excursion without splinting or tachypnea; breath sounds clear and equal bilaterally; no wheezes, no rhonchi, no rales, no hypoxia or respiratory  distress, speaking full sentences ABD/GI: Normal bowel sounds; non-distended; soft, tenderness to right upper quadrant and epigastric region with voluntary guarding, no rebound, no peritoneal signs, no hepatosplenomegaly CHEST: Tenderness over the right lateral chest wall without crepitus, ecchymosis, erythema, or warmth BACK:  The back appears normal and is tender in the right thoracic area without any deformity, no midline spinal tenderness or  step-off or deformity EXT: Normal ROM in all joints; non-tender to palpation; no edema; normal capillary refill; no cyanosis, no calf tenderness or swelling    SKIN: Normal color for age and race; warm; no rash NEURO: Moves all extremities equally, sensation to light touch intact diffusely, cranial nerves II through XII intact, normal speech PSYCH: The patient's mood and manner are appropriate. Grooming and personal hygiene are appropriate.  MEDICAL DECISION MAKING: Patient here with right flank pain and right upper quadrant abdominal pain. Was seen here in the emergency department on 2/20 for the same and had an unremarkable workup other than urinary tract infection. Urine culture grows Klebsiella and she was on Levaquin. Urine culture shows that bacteria is sensitive to fluoroquinolones. Patient reports symptoms however getting worse at home. She does seem tender to palpation throughout the right upper quadrant, right lateral chest and right thoracic region without any obvious deformity. No injury to this area. Differential includes kidney infection, less likely kidney stone, cholangitis, stone in the biliary tree, PE, less likely ACS. She did have a CT of her abdomen and pelvis which showed hepatic steatosis but was otherwise unremarkable. Kidneys appear normal. She is not tender at McBurney's point have low suspicion for appendicitis. We'll obtain labs, EKG, chest x-ray of the left ribs, give pain and nausea medication.  ED PROGRESS: Patient's labs are unremarkable. Negative troponin and negative d-dimer. X-ray of the left ribs and chest is normal. She continues have significant pain and appears uncomfortable in the bed despite Dilaudid, Toradol, fentanyl. Her urine still appears to be infected with too numerous to count white blood cells, bacteria and is nitrite positive. We'll give dose of IV ceftriaxone. Suspect that this could be pyelonephritis. Have offered her admission to the hospital for  antibiotics and pain control. She would prefer this over discharge home. She does not currently have a PCP. [facility] Maryclare LabradorWe'll discuss with hospitalist.   3:30 AM  D/w Dr. Maryfrances Bunnellanford with hospitalist service. He will place the admission orders. Patient's blood pressure has dropped slightly after receiving IV narcotics. She has a normal heart rate. I do not feel this is sepsis. She is giving IV hydration. Patient has been updated with plan. [facility]   I reviewed all nursing notes, vitals, pertinent old records, EKGs, labs, imaging (as available).   EKG Interpretation  Date/Time:  Monday May 24 2016 01:45:58 EST Ventricular Rate:  65 PR Interval:    QRS Duration: 103 QT Interval:  470 QTC Calculation: 489 R Axis:   49 Text Interpretation:  Sinus rhythm Borderline prolonged QT interval No significant change since last tracing Confirmed by Wendle Kina,  DO, Rukiya Hodgkins ([id number]) on 05/24/2016 1:58:29 AM         I personally performed the services described in this documentation, which was scribed in my presence. The recorded information has been reviewed and is accurate.    Layla MawKristen N Noemi Bellissimo, DO 05/24/16 [phone]

## 2016-05-24 NOTE — Progress Notes (Signed)
Initial Nutrition Assessment  DOCUMENTATION CODES:   Obesity unspecified  INTERVENTION:  Glucerna Shake po daily, each supplement provides 220 kcal and 10 grams of protein  NUTRITION DIAGNOSIS:   Inadequate oral intake related to poor appetite, acute illness as evidenced by per patient/family report.  GOAL:   Patient will meet greater than or equal to 90% of their needs  MONITOR:   PO intake, Supplement acceptance, Labs  REASON FOR ASSESSMENT:   Consult Diet education  ASSESSMENT:   46 y.o. female who presents with sx most consistent w/ pyelonephritis. H/o the same in th past. CT perfromed on 05/18/16 w/o significant abnormality other thatn hepatic steatosis. Current description of Abd pain may also be due to gastroparesis. Will initiate Reglan. May need gastric emptying studya at a later time. Consider repeat if not improving by 05/25/16.    Met with pt in room today. Pt reports poor appetite for several days pta r/t acute illness. Pt reports appetite improving now but pt still eating <90% meals. Pt gave up beef for Malena Edman but does eat chicken and fish. Pt currently skipping breakfast, drinking several sodas per day, and drinking sweet tea.  Per pt, no changes in weight recently. Pt was educated regarding a diabetic diet today.   Medications reviewed and include: ceftriaxone, lovenox, insulin, reglan, hydrocodone, hydromorphone, zofran   Labs reviewed: cbgs- 334, 300 x 24 hrs  Nutrition-Focused physical exam completed. Findings are no fat depletion, no muscle depletion, and no edema.   Diet Order:  Diet Carb Modified Fluid consistency: Thin; Room service appropriate? Yes  Skin:  Reviewed, no issues  Last BM:  2/25  Height:   Ht Readings from Last 1 Encounters:  05/24/16 '5\' 11"'  (1.803 m)    Weight:   Wt Readings from Last 1 Encounters:  05/24/16 254 lb 13 oz (115.6 kg)    Ideal Body Weight:  70.4 kg  BMI:  Body mass index is 35.54 kg/m.  Estimated Nutritional  Needs:   Kcal:  1800-2100kcal/day   Protein:  70-84g/day   Fluid:  >1.8L/day   EDUCATION NEEDS:   Education needs addressed  Koleen Distance, RD, LDN Pager #- [phone]

## 2016-05-24 NOTE — H&P (Signed)
History and Physical    Natalie Brooks ZOX:[id number] DOB: [birth year] DOA: 05/23/2016  PCP: Pcp Not In System Patient coming from: home  Chief Complaint: abdominal pain  HPI: Natalie Brooks is a very pleasant 46 y.o. female with medical history significant of obesity diabetes urgency department with chief complaint right flank pain. Initial evaluation reveals hyperglycemia pyelonephritis  Information is obtained from the patient. She states she has had a urinary tract infection "off and on" for the last several weeks. She completed a course of Levaquin. She states that her right flank pain and abdominal pain worsened over the last 5 days. She describes the pain as constant dull. Associated symptoms include nausea without vomiting. She denies chest pain palpitation shortness of breath headache dizziness syncope or near-syncope. She denies fever chills dysuria hematuria frequency or urgency. She denies diarrhea constipation melena or bright red blood per rectum. She denies vaginal bleeding or discharge. She does report decreased oral intake.     ED Course: In the emergency department she's afebrile hemodynamically stable and not hypoxic. She did get a dose of fentanyl and blood pressure became a little soft. She received Rocephin and IV fluids as well  Review of Systems: As per HPI otherwise 10 point review of systems negative.   Ambulatory Status: Ambulates independently. Is independent with ADLs  Past Medical History:  Diagnosis Date  . Diabetes mellitus without complication (HCC)   . Hepatic steatosis   . Obesity   . Pyelonephritis   . Tobacco abuse     Past Surgical History:  Procedure Laterality Date  . CHOLECYSTECTOMY    . JOINT REPLACEMENT      Social History   Social History  . Marital status: Married    Spouse name: N/A  . Number of children: N/A  . Years of education: N/A   Occupational History  . Not on file.   Social History Main Topics  . Smoking  status: Current Every Day Smoker    Packs/day: 1.00    Types: Cigarettes  . Smokeless tobacco: Never Used  . Alcohol use No  . Drug use: No  . Sexual activity: Not on file   Other Topics Concern  . Not on file   Social History Narrative  . No narrative on file  She lives at home with her husband and her child. She is  full-time with [employer].  Allergies  Allergen Reactions  . Penicillins Hives    Family History  Problem Relation Age of Onset  . Breast cancer Mother   . Diabetes Mother   . Diabetes Sister   . Diabetes Brother     Prior to Admission medications   Medication Sig Start Date End Date Taking? Authorizing Provider  levofloxacin (LEVAQUIN) 750 MG tablet Take 1 tablet (750 mg total) by mouth daily. 05/18/16 05/25/16  Shaune Pollack, MD  metFORMIN (GLUCOPHAGE) 500 MG tablet Take 500 mg by mouth 2 (two) times daily with a meal.    Historical Provider, MD  oxyCODONE (ROXICODONE) 5 MG immediate release tablet Take 1 tablet (5 mg total) by mouth every 4 (four) hours as needed for severe pain. 05/18/16   Shaune Pollack, MD  promethazine (PHENERGAN) 25 MG tablet Take 1 tablet (25 mg total) by mouth every 6 (six) hours as needed for nausea or vomiting. 05/18/16   Shaune Pollack, MD    Physical Exam: Vitals:   05/24/16 0430 05/24/16 0500 05/24/16 0500 05/24/16 0646  BP: 100/64 108/60 108/60   Pulse: 63 68  Resp: 14 16 22    Temp:      TempSrc:      SpO2: 91% 96% 97%   Weight:    115.6 kg (254 lb 13 oz)  Height:    5\' 11"  (1.803 m)     General:  Appears Calm, cooperative, in no acute distress Eyes:  PERRL, EOMI, normal lids, iris ENT:  grossly normal hearing, lips & tongue, mucous membranes of her mouth are pink but dry Neck:  no LAD, masses or thyromegaly Cardiovascular:  RRR, no m/r/g. No LE edema. Pedal pulses present and palpable Respiratory:  CTA bilaterally, no w/r/r. Normal respiratory effort. Abdomen:  soft, ntnd, obese positive bowel sounds throughout  but sluggish no guarding or rebounding Skin:  no rash or induration seen on limited exam Musculoskeletal:  grossly normal tone BUE/BLE, good ROM, no bony abnormality Psychiatric:  grossly normal mood and affect, speech fluent and appropriate, AOx3 Neurologic:  CN 2-12 grossly intact, moves all extremities in coordinated fashion, sensation intact  Labs on Admission: I have personally reviewed following labs and imaging studies  CBC:  Recent Labs Lab 05/18/16 0602 05/24/16 0108  WBC 7.8 8.5  NEUTROABS  --  4.4  HGB 14.5 14.9  HCT 42.9 42.6  MCV 89.6 88.6  PLT 243 287   Basic Metabolic Panel:  Recent Labs Lab 05/18/16 0602 05/24/16 0108  NA 136 136  K 3.8 3.5  CL 105 102  CO2 23 26  GLUCOSE 334* 300*  BUN 15 15  CREATININE 0.88 0.87  CALCIUM 8.9 9.5   GFR: Estimated Creatinine Clearance: 111.9 mL/min (by C-G formula based on SCr of 0.87 mg/dL). Liver Function Tests:  Recent Labs Lab 05/18/16 0602 05/24/16 0108  AST 19 19  ALT 24 33  ALKPHOS 77 81  BILITOT 0.5 0.3  PROT 7.3 7.4  ALBUMIN 3.8 4.0    Recent Labs Lab 05/18/16 0602 05/24/16 0108  LIPASE 23 24   No results for input(s): AMMONIA in the last 168 hours. Coagulation Profile: No results for input(s): INR, PROTIME in the last 168 hours. Cardiac Enzymes:  Recent Labs Lab 05/18/16 0602 05/18/16 0929 05/24/16 0108  TROPONINI <0.03 <0.03 <0.03   BNP (last 3 results) No results for input(s): PROBNP in the last 8760 hours. HbA1C: No results for input(s): HGBA1C in the last 72 hours. CBG:  Recent Labs Lab 05/24/16 0418  GLUCAP 276*   Lipid Profile: No results for input(s): CHOL, HDL, LDLCALC, TRIG, CHOLHDL, LDLDIRECT in the last 72 hours. Thyroid Function Tests: No results for input(s): TSH, T4TOTAL, FREET4, T3FREE, THYROIDAB in the last 72 hours. Anemia Panel: No results for input(s): VITAMINB12, FOLATE, FERRITIN, TIBC, IRON, RETICCTPCT in the last 72 hours. Urine analysis:      Component Value Date/Time   COLORURINE YELLOW 05/24/2016 0232   APPEARANCEUR CLEAR 05/24/2016 0232   LABSPEC 1.038 (H) 05/24/2016 0232   PHURINE 5.0 05/24/2016 0232   GLUCOSEU >=500 (A) 05/24/2016 0232   HGBUR NEGATIVE 05/24/2016 0232   BILIRUBINUR NEGATIVE 05/24/2016 0232   KETONESUR NEGATIVE 05/24/2016 0232   PROTEINUR NEGATIVE 05/24/2016 0232   NITRITE POSITIVE (A) 05/24/2016 0232   LEUKOCYTESUR NEGATIVE 05/24/2016 0232    Creatinine Clearance: Estimated Creatinine Clearance: 111.9 mL/min (by C-G formula based on SCr of 0.87 mg/dL).  Sepsis Labs: @LABRCNTIP (procalcitonin:4,lacticidven:4) ) Recent Results (from the past 240 hour(s))  Urine culture     Status: Abnormal   Collection Time: 05/18/16  5:50 AM  Result Value Ref Range Status   Specimen  Description URINE, CLEAN CATCH  Final   Special Requests NONE  Final   Culture >=100,000 COLONIES/mL KLEBSIELLA PNEUMONIAE (A)  Final   Report Status 05/20/2016 FINAL  Final   Organism ID, Bacteria KLEBSIELLA PNEUMONIAE (A)  Final      Susceptibility   Klebsiella pneumoniae - MIC*    AMPICILLIN RESISTANT Resistant     CEFAZOLIN <=4 SENSITIVE Sensitive     CEFTRIAXONE <=1 SENSITIVE Sensitive     CIPROFLOXACIN <=0.25 SENSITIVE Sensitive     GENTAMICIN <=1 SENSITIVE Sensitive     IMIPENEM <=0.25 SENSITIVE Sensitive     NITROFURANTOIN 64 INTERMEDIATE Intermediate     TRIMETH/SULFA <=20 SENSITIVE Sensitive     AMPICILLIN/SULBACTAM 4 SENSITIVE Sensitive     PIP/TAZO <=4 SENSITIVE Sensitive     Extended ESBL NEGATIVE Sensitive     * >=100,000 COLONIES/mL KLEBSIELLA PNEUMONIAE     Radiological Exams on Admission: Dg Ribs Unilateral W/chest Right  Result Date: 05/24/2016 CLINICAL DATA:  Right lateral chest pain, rib pain. Right upper quadrant pain. EXAM: RIGHT RIBS AND CHEST - 3+ VIEW COMPARISON:  Chest radiographs and abdominal CT 05/18/2016 FINDINGS: No fracture or other bone lesions are seen involving the ribs. There is no  evidence of pneumothorax or pleural effusion. Both lungs are clear. Heart size and mediastinal contours are within normal limits. IMPRESSION: Negative radiographs of the chest and right ribs. Electronically Signed   By: Rubye Oaks M.D.   On: 05/24/2016 00:55    EKG: Independently reviewed. Sinus rhythm Borderline prolonged QT interval No significant change since last tracing   Assessment/Plan Principal Problem:   Pyelonephritis Active Problems:   Diabetes mellitus without complication (HCC)   Abdominal pain   Obesity   Hepatic steatosis   Tobacco use disorder   1. Pyelonephritis. CT abdomen pelvis done last week revealed pyuria. Given Levaquin but pain worsened. Recent urine culture with FQ sensitive Klebsiella. She has a soft blood pressure from Dilaudid in the emergency department. She is afebrile no leukocytosis nontoxic appearing -Admit to MedSurg for observation -Vigorous IV fluids -Rocephin per pharmacy -Monitor intake and output -Follow urine culture  #2. Abdominal pain right flank pain. Likely related to above in the setting of possible gastroparesis related to uncontrolled diabetes. Recent CT of the abdomen revealed hepatic steatosis, current liver function tests within the limits of normal -Supportive therapy -Improved diabetes control -If no improvement consider reimaging tomorrow  3. Diabetes. Uncontrolled. Patient admits to passive noncompliance. Currently she has no PCP. Was on metformin in the past but she had GI side effects so she stopped. Reports having the equipment needed to monitor blood sugar. Serum glucose 300 on admission. -Obtain a hemoglobin A1c -Sliding scale insulin for optimal control -Diabetes coordinator -This manager assist with PCP  #4. Hepatic steatosis. Per CT as noted above. Current liver function tests within the limits of normal -Outpatient monitoring  5. Obesity. BMI 35.6. -Nutritional consult -Will need close outpatient  follow-up  6. Tobacco use. -Cessation counseling offered    DVT prophylaxis: lovenox Code Status: full Family Communication: husband at bedside  Disposition Plan: home  Consults called: diabetes coordinator, case mang  Admission status: obs    Toya Smothers M MD [facility]  If 7PM-7AM, please contact night-coverage [email or web address] Password [facility]  05/24/2016, 7:56 AM

## 2016-05-24 NOTE — Progress Notes (Signed)
Pharmacy Antibiotic Note  Natalie PatchChristina Brooks is a 46 y.o. female admitted on 05/23/2016 with UTI.  Pharmacy has been consulted for Rocephin dosing.  Plan: Rocephin 1gm IV q24h Pharmacy will sign off - please reconsult if needed  Height: 5\' 11"  (180.3 cm) Weight: 254 lb 13 oz (115.6 kg) IBW/kg (Calculated) : 70.8  Temp (24hrs), Avg:98.2 F (36.8 C), Min:98.2 F (36.8 C), Max:98.2 F (36.8 C)   Recent Labs Lab 05/18/16 0602 05/24/16 0108  WBC 7.8 8.5  CREATININE 0.88 0.87    Estimated Creatinine Clearance: 111.9 mL/min (by C-G formula based on SCr of 0.87 mg/dL).    Allergies  Allergen Reactions  . Penicillins Hives   Thank you for allowing pharmacy to be a part of this patient's care.  Christoper Fabianaron Osamah Schmader, PharmD, BCPS Clinical pharmacist, pager [phone] 05/24/2016 6:55 AM

## 2016-05-24 NOTE — ED Notes (Signed)
Alert, NAD, calm, interactive, resps e/u, speaking in clear complete sentences, no dyspnea noted, skin W&D, VSS, (denies: nausea, sob, or dizziness). [facility] Pt updated, "feel a little better", no changes.

## 2016-05-24 NOTE — Progress Notes (Signed)
46 yo F with NIDDM who presents with right flank pain worsening in setting of UTI.  Came in 1 week ago for few weeks of right flank pain (or RUQ *abdominal* pain), had unremarkable CT abd/pelvis with contrast, found to have pyuria, discharged with Levaquin for UTI.  (Culture eventually grew FQ-sensitive Klebsiella).  Now returns with persistent RUQ pain, nausea despite taking Levaquin as prescribed. Of note, she is post-cholecystectomy. BP (!) 99/52   Pulse 63   Temp 98.2 F (36.8 C) (Oral)   Resp 13   Ht 5\' 11"  (1.803 m)   Wt 108.9 kg (240 lb)   LMP 01/28/2016 (Within Weeks)   SpO2 96%   BMI 33.47 kg/m   Na 136 K 3.5 Cr 0.87 WBC 8.5 Hgb 14.9 LFTs normal Negative d-dimer Glucose >300 Lipase normal UA shows persistent high-grade pyuria  Given fluids, ceftriaxone, insulin Pain not controlled with Toradol, fentanyl, hydromorphone, and so TRH asked to observe for pain control, unclear story.  BP soft after Dilaudid, but HR stable, mentating well.  To med surg bed, OBS status.  May need CT with contrast when here to rule out perinephric abscess.

## 2016-05-24 NOTE — Progress Notes (Signed)
Inpatient Diabetes Program Recommendations  AACE/ADA: New Consensus Statement on Inpatient Glycemic Control (2015)  Target Ranges:  Prepandial:   less than 140 mg/dL      Peak postprandial:   less than 180 mg/dL (1-2 hours)      Critically ill patients:  140 - 180 mg/dL   Lab Results  Component Value Date   GLUCAP 294 (H) 05/24/2016    Review of Glycemic Control  Diabetes history: DM2, Obesity  Inpatient Diabetes Program Recommendations:   Insulin - Basal: Please consider Lantus 20 units daily at bedtime  Correction (SSI): Agree with moderate correction of Novolog 0-15 units tidac and 0-5 units qhs  Oral Agents: Patient was not taking Metformin due to GI side effects, discussed extended release form and need to control CBG's long term  Diet: Discussed changes in diet recommended in the Living Well with DM book and My plate handout sheet (both given to patient)  Spoke at bedside with patient regarding self-management of diabetes.  1.  Taught patient the following (teach back and/or return demonstration):  Hypo/Hyperglycemia  Medications at D/C (what these are, why taking, when taking, how taking, common S.E.'s)  CBG monitoring, A1C  How/why to check feet every day  Why exercise is important   Carb modified diet  2.  Identified barriers and facilitators to self-management goals:  Need to change lifestyle  3.  Identified support systems:  Husband/family  Thank you,  Kristine LineaKaren Mieka Leaton, RN, MSN Diabetes Coordinator Inpatient Diabetes Program [phone] (Team Pager)

## 2016-05-24 NOTE — Progress Notes (Signed)
  RD consulted for nutrition education regarding diabetes.   RD provided "Nutiriton and Typle II Diabetes" handout from the Academy of Nutrition and Dietetics. Discussed different food groups and their effects on blood sugar, emphasizing carbohydrate-containing foods. Provided list of carbohydrates and recommended serving sizes of common foods.  Discussed importance of controlled and consistent carbohydrate intake throughout the day. Provided examples of ways to balance meals/snacks and encouraged intake of high-fiber, whole grain complex carbohydrates. Teach back method used.  Expect fair compliance.  Body mass index is 35.54 kg/m. Pt meets criteria for obese based on current BMI.  Current diet order is carbohydrate controlled, patient is consuming approximately 75% of meals at this time. Labs and medications reviewed.   RD following this pt.  Betsey Holidayasey Gini Caputo, RD, LDN Pager #- [phone]

## 2016-05-24 NOTE — Progress Notes (Signed)
Pt was admitted from [facility], transported by care link accompanied by pt family, she was fully alert and oriented on arrival, self introduced to pt and family ID bracelet checked fall prevention plan discussed with pt, skin assessment done with another, pt made comfortable in bed admision MD notified of pt arrival to the floor waiting on doctors orders, call light and phone within reach will continue to monitor pt,

## 2016-05-24 NOTE — ED Notes (Signed)
Pt not in room, pt in radiology.  

## 2016-05-25 DIAGNOSIS — E118 Type 2 diabetes mellitus with unspecified complications: Secondary | ICD-10-CM | POA: Diagnosis not present

## 2016-05-25 DIAGNOSIS — Z833 Family history of diabetes mellitus: Secondary | ICD-10-CM | POA: Diagnosis not present

## 2016-05-25 DIAGNOSIS — N10 Acute tubulo-interstitial nephritis: Secondary | ICD-10-CM | POA: Diagnosis present

## 2016-05-25 DIAGNOSIS — Z7984 Long term (current) use of oral hypoglycemic drugs: Secondary | ICD-10-CM | POA: Diagnosis not present

## 2016-05-25 DIAGNOSIS — Z79899 Other long term (current) drug therapy: Secondary | ICD-10-CM | POA: Diagnosis not present

## 2016-05-25 DIAGNOSIS — E669 Obesity, unspecified: Secondary | ICD-10-CM | POA: Diagnosis present

## 2016-05-25 DIAGNOSIS — F17200 Nicotine dependence, unspecified, uncomplicated: Secondary | ICD-10-CM | POA: Diagnosis not present

## 2016-05-25 DIAGNOSIS — N12 Tubulo-interstitial nephritis, not specified as acute or chronic: Secondary | ICD-10-CM | POA: Diagnosis present

## 2016-05-25 DIAGNOSIS — E1165 Type 2 diabetes mellitus with hyperglycemia: Secondary | ICD-10-CM | POA: Diagnosis present

## 2016-05-25 DIAGNOSIS — F17210 Nicotine dependence, cigarettes, uncomplicated: Secondary | ICD-10-CM | POA: Diagnosis present

## 2016-05-25 DIAGNOSIS — K760 Fatty (change of) liver, not elsewhere classified: Secondary | ICD-10-CM | POA: Diagnosis present

## 2016-05-25 DIAGNOSIS — Z9119 Patient's noncompliance with other medical treatment and regimen: Secondary | ICD-10-CM | POA: Diagnosis not present

## 2016-05-25 DIAGNOSIS — Z880 Allergy status to penicillin: Secondary | ICD-10-CM | POA: Diagnosis not present

## 2016-05-25 DIAGNOSIS — E119 Type 2 diabetes mellitus without complications: Secondary | ICD-10-CM | POA: Diagnosis not present

## 2016-05-25 DIAGNOSIS — B961 Klebsiella pneumoniae [K. pneumoniae] as the cause of diseases classified elsewhere: Secondary | ICD-10-CM | POA: Diagnosis present

## 2016-05-25 DIAGNOSIS — Z6835 Body mass index (BMI) 35.0-35.9, adult: Secondary | ICD-10-CM | POA: Diagnosis not present

## 2016-05-25 LAB — CBC
HCT: 40.9 % (ref 36.0–46.0)
HEMOGLOBIN: 13.5 g/dL (ref 12.0–15.0)
MCH: 29.9 pg (ref 26.0–34.0)
MCHC: 33 g/dL (ref 30.0–36.0)
MCV: 90.7 fL (ref 78.0–100.0)
Platelets: 205 10*3/uL (ref 150–400)
RBC: 4.51 MIL/uL (ref 3.87–5.11)
RDW: 13.1 % (ref 11.5–15.5)
WBC: 6.8 10*3/uL (ref 4.0–10.5)

## 2016-05-25 LAB — BASIC METABOLIC PANEL
Anion gap: 6 (ref 5–15)
BUN: 8 mg/dL (ref 6–20)
CO2: 28 mmol/L (ref 22–32)
CREATININE: 0.66 mg/dL (ref 0.44–1.00)
Calcium: 8.8 mg/dL — ABNORMAL LOW (ref 8.9–10.3)
Chloride: 101 mmol/L (ref 101–111)
GFR calc Af Amer: >60 mL/min (ref >60)
GLUCOSE: 245 mg/dL — AB (ref 65–99)
POTASSIUM: 4.6 mmol/L (ref 3.5–5.1)
Sodium: 135 mmol/L (ref 135–145)

## 2016-05-25 LAB — GLUCOSE, CAPILLARY
GLUCOSE-CAPILLARY: 237 mg/dL — AB (ref 65–99)
GLUCOSE-CAPILLARY: 201 mg/dL — AB (ref 65–99)
Glucose-Capillary: 248 mg/dL — ABNORMAL HIGH (ref 65–99)
Glucose-Capillary: 184 mg/dL — ABNORMAL HIGH (ref 65–99)

## 2016-05-25 LAB — HEMOGLOBIN A1C
HEMOGLOBIN A1C: 12 % — AB (ref 4.8–5.6)
MEAN PLASMA GLUCOSE: 298 mg/dL

## 2016-05-25 MED ORDER — INSULIN GLARGINE 100 UNIT/ML ~~LOC~~ SOLN
20.0000 [IU] | Freq: Every day | SUBCUTANEOUS | Status: DC
  Administered 2016-05-25: 20 [IU] via SUBCUTANEOUS
  Filled 2016-05-25 (×2): qty 0.2

## 2016-05-25 MED ORDER — SODIUM CHLORIDE 0.9 % IV SOLN
INTRAVENOUS | Status: DC
  Administered 2016-05-25 – 2016-05-26 (×3): via INTRAVENOUS
  Filled 2016-05-25 (×3): qty 1000

## 2016-05-25 MED ORDER — ONDANSETRON HCL 4 MG PO TABS
8.0000 mg | ORAL_TABLET | Freq: Four times a day (QID) | ORAL | Status: DC | PRN
  Filled 2016-05-25: qty 2

## 2016-05-25 MED ORDER — PROCHLORPERAZINE EDISYLATE 5 MG/ML IJ SOLN: 10.0000 mg | Freq: Four times a day (QID) | INTRAMUSCULAR | Status: DC | PRN

## 2016-05-25 MED ORDER — OXYCODONE-ACETAMINOPHEN 5-325 MG PO TABS: 2.0000 | ORAL_TABLET | ORAL | Status: DC | PRN

## 2016-05-25 MED ORDER — KETOROLAC TROMETHAMINE 30 MG/ML IJ SOLN: 30.0000 mg | Freq: Four times a day (QID) | INTRAMUSCULAR | Status: DC | PRN

## 2016-05-25 MED ORDER — ONDANSETRON HCL 40 MG/20ML IJ SOLN
8.0000 mg | Freq: Four times a day (QID) | INTRAMUSCULAR | Status: DC | PRN
Start: 2016-05-25 — End: 2016-05-26
  Filled 2016-05-25: qty 4

## 2016-05-25 NOTE — Care Management Note (Signed)
Case Management Note  Patient Details  Name: Natalie Brooks MRN: [id number] Date of Birth: 09/25/1970  Subjective/Objective:                 Admitted with pyelonephritis. From home with husband.  Natalie Brooks (Spouse)     [phone]       Action/Plan:  CM received consult:Needs PCP for diabetes management.  CM spoke with pt/husband regarding the need for PCP. Pt with insurance, CM provided pt with [facility] information to navigate search for  PCP. Pt is to d/c when medically stable. CM to f/u with disposition needs.  Expected Discharge Date:                  Expected Discharge Plan:  Home/Self Care  In-House Referral:     Discharge planning Services  CM Consult, Follow-up appt scheduled (post hospital follow up scheduled for 05/27/2016 @ 2:30 pm [facility])  Post Acute Care Choice:    Choice offered to:     DME Arranged:    DME Agency:     HH Arranged:    HH Agency:     Status of Service:  In process, will continue to follow  If discussed at Long Length of Stay Meetings, dates discussed:    Additional Comments:  Epifanio LeschesCole, Tami Blass Hudson, RN 05/25/2016, 12:42 PM

## 2016-05-25 NOTE — Progress Notes (Signed)
Inpatient Diabetes Program Recommendations  AACE/ADA: New Consensus Statement on Inpatient Glycemic Control (2015)  Target Ranges:  Prepandial:   less than 140 mg/dL      Peak postprandial:   less than 180 mg/dL (1-2 hours)      Critically ill patients:  140 - 180 mg/dL   Lab Results  Component Value Date   GLUCAP 248 (H) 05/25/2016   HGBA1C 12.0 (H) 05/24/2016    Review of Glycemic Control  Blood sugars > 180 mg/dL. Needs insulin adjustment.  Recommendations: Increase Lantus to 24 units QD. Add Novolog 4 units tidwc for meal coverage insulin.  Continue to educate pt on her diabetes care. Long discussion with pt regarding importance of checking blood sugars, taking insulin and f/u with PCP. Case manager assisting with PCP appt. Pt very resistant to pricking fingers and giving insulin.  Will discuss with RN.  F/U in am.  Thank you. Ailene Ardshonda Berdena Cisek, RD, LDN, CDE Inpatient Diabetes Coordinator [phone]

## 2016-05-25 NOTE — Progress Notes (Addendum)
PROGRESS NOTE    Natalie Brooks  ZOX:[id number] DOB: Apr 13, 1970 DOA: 05/23/2016 PCP: Pcp Not In System    Brief Narrative:  Patient is a pleasant 46 year old female with history of diabetes, obesity, hepatic steatosis presented to the ED with right-sided/right flank pain, abdominal pain, nausea over the past 5 days prior to admission. Patient did state having treated for UTI off and on over the last several weeks. Patient with decreased oral intake. Patient admitted placed empirically on IV Rocephin. Urine cultures pending.  Assessment & Plan:   Principal Problem:   Pyelonephritis Active Problems:   Diabetes mellitus without complication (HCC)   Abdominal pain   Obesity   Hepatic steatosis   Tobacco use disorder   Diabetes mellitus with complication (HCC)  #1 acute pyelonephritis/Klebsiella pneumoniae UTI Patient with complaints of right-sided/right flank pain. Patient now with bilious emesis. Patient states antiemetics do not last long enough. Patient denies any dysuria. Urinalysis consistent with a UTI with positive nitrites, too numerous to count WBCs, specific gravity 1.038. Urine cultures preliminary with Klebsiella pneumonia UTI. Sensitivities pending. Continue empiric IV Rocephin. Place patient on a clear liquid diet. Change Vicodin to oxycodone every 4 hours when necessary. Place on IV Toradol as needed. Compazine as needed. Increase Zofran to 8 mg every 6 hours as needed. IV fluids. Supportive care.  #2 diabetes mellitus Hemoglobin A1c is 12.0. CBGs have ranged from 184-248. Place patient on Lantus 20 units daily. Sliding scale insulin. Consult with diabetic coordinator. Outpatient follow-up.  #3 hepatic steatosis Per CT done a few days prior to admission. LFTs within normal limits. Outpatient follow-up.  #4 obesity BMI of 35.6.  Nutritional consultation. Outpatient follow-up.   DVT prophylaxis: Lovenox Code Status: Full Family Communication: Updated patient. No family  at bedside. Disposition Plan: Home when tolerating oral intake with resolution of nausea and emesis. Improvement with right flank pain. Hopefully home in the next 2-3 days.   Consultants:   None  Procedures:   None  Antimicrobials:   IV Rocephin 05/24/2016   Subjective: Patient complaining of bilious emesis. No shortness of breath. No chest pain. Patient complaining of right side pain. Patient denies any dysuria.  Objective: Vitals:   05/24/16 0646 05/24/16 1341 05/24/16 2234 05/25/16 0510  BP:  (!) 116/59 (!) 114/52 (!) 112/51  Pulse:  72 67 76  Resp:  18 18 16   Temp:   98.3 F (36.8 C) 98.4 F (36.9 C)  TempSrc:   Oral   SpO2:  98% 92% 94%  Weight: 115.6 kg (254 lb 13 oz)     Height: 5\' 11"  (1.803 m)       Intake/Output Summary (Last 24 hours) at 05/25/16 1259 Last data filed at 05/25/16 1229  Gross per 24 hour  Intake          1098.33 ml  Output                0 ml  Net          1098.33 ml   Filed Weights   05/23/16 2229 05/24/16 0646  Weight: 108.9 kg (240 lb) 115.6 kg (254 lb 13 oz)    Examination:  General exam: Appears calm and comfortable  Respiratory system: Clear to auscultation. Respiratory effort normal. Cardiovascular system: S1 & S2 heard, RRR. No JVD, murmurs, rubs, gallops or clicks. No pedal edema. Gastrointestinal system: Abdomen is nondistended, soft. No organomegaly or masses felt. Normal bowel sounds heard. Right flank pain. Discomfort upper abdominal region. Central nervous system:  Alert and oriented. No focal neurological deficits. Extremities: Symmetric 5 x 5 power. Skin: No rashes, lesions or ulcers Psychiatry: Judgement and insight appear normal. Mood & affect appropriate.     Data Reviewed: I have personally reviewed following labs and imaging studies  CBC:  Recent Labs Lab 05/24/16 0108 05/25/16 0623  WBC 8.5 6.8  NEUTROABS 4.4  --   HGB 14.9 13.5  HCT 42.6 40.9  MCV 88.6 90.7  PLT 287 205   Basic Metabolic  Panel:  Recent Labs Lab 05/24/16 0108 05/25/16 1130  NA 136 135  K 3.5 4.6  CL 102 101  CO2 26 28  GLUCOSE 300* 245*  BUN 15 8  CREATININE 0.87 0.66  CALCIUM 9.5 8.8*   GFR: Estimated Creatinine Clearance: 121.7 mL/min (by C-G formula based on SCr of 0.66 mg/dL). Liver Function Tests:  Recent Labs Lab 05/24/16 0108  AST 19  ALT 33  ALKPHOS 81  BILITOT 0.3  PROT 7.4  ALBUMIN 4.0    Recent Labs Lab 05/24/16 0108  LIPASE 24   No results for input(s): AMMONIA in the last 168 hours. Coagulation Profile: No results for input(s): INR, PROTIME in the last 168 hours. Cardiac Enzymes:  Recent Labs Lab 05/24/16 0108  TROPONINI <0.03   BNP (last 3 results) No results for input(s): PROBNP in the last 8760 hours. HbA1C:  Recent Labs  05/24/16 0751  HGBA1C 12.0*   CBG:  Recent Labs Lab 05/24/16 0833 05/24/16 1129 05/24/16 1727 05/24/16 2234 05/25/16 0825  GLUCAP 294* 192* 293* 202* 237*   Lipid Profile: No results for input(s): CHOL, HDL, LDLCALC, TRIG, CHOLHDL, LDLDIRECT in the last 72 hours. Thyroid Function Tests: No results for input(s): TSH, T4TOTAL, FREET4, T3FREE, THYROIDAB in the last 72 hours. Anemia Panel: No results for input(s): VITAMINB12, FOLATE, FERRITIN, TIBC, IRON, RETICCTPCT in the last 72 hours. Sepsis Labs: No results for input(s): PROCALCITON, LATICACIDVEN in the last 168 hours.  Recent Results (from the past 240 hour(s))  Urine culture     Status: Abnormal   Collection Time: 05/18/16  5:50 AM  Result Value Ref Range Status   Specimen Description URINE, CLEAN CATCH  Final   Special Requests NONE  Final   Culture >=100,000 COLONIES/mL KLEBSIELLA PNEUMONIAE (A)  Final   Report Status 05/20/2016 FINAL  Final   Organism ID, Bacteria KLEBSIELLA PNEUMONIAE (A)  Final      Susceptibility   Klebsiella pneumoniae - MIC*    AMPICILLIN RESISTANT Resistant     CEFAZOLIN <=4 SENSITIVE Sensitive     CEFTRIAXONE <=1 SENSITIVE Sensitive      CIPROFLOXACIN <=0.25 SENSITIVE Sensitive     GENTAMICIN <=1 SENSITIVE Sensitive     IMIPENEM <=0.25 SENSITIVE Sensitive     NITROFURANTOIN 64 INTERMEDIATE Intermediate     TRIMETH/SULFA <=20 SENSITIVE Sensitive     AMPICILLIN/SULBACTAM 4 SENSITIVE Sensitive     PIP/TAZO <=4 SENSITIVE Sensitive     Extended ESBL NEGATIVE Sensitive     * >=100,000 COLONIES/mL KLEBSIELLA PNEUMONIAE  Urine culture     Status: Abnormal (Preliminary result)   Collection Time: 05/24/16  2:32 AM  Result Value Ref Range Status   Specimen Description URINE, RANDOM  Final   Special Requests NONE  Final   Culture >=100,000 COLONIES/mL KLEBSIELLA PNEUMONIAE (A)  Final   Report Status PENDING  Incomplete         Radiology Studies: Dg Ribs Unilateral W/chest Right  Result Date: 05/24/2016 CLINICAL DATA:  Right lateral chest pain, rib  pain. Right upper quadrant pain. EXAM: RIGHT RIBS AND CHEST - 3+ VIEW COMPARISON:  Chest radiographs and abdominal CT 05/18/2016 FINDINGS: No fracture or other bone lesions are seen involving the ribs. There is no evidence of pneumothorax or pleural effusion. Both lungs are clear. Heart size and mediastinal contours are within normal limits. IMPRESSION: Negative radiographs of the chest and right ribs. Electronically Signed   By: Rubye OaksMelanie  Ehinger M.D.   On: 05/24/2016 00:55        Scheduled Meds: . cefTRIAXone (ROCEPHIN)  IV  1 g Intravenous Q24H  . enoxaparin (LOVENOX) injection  60 mg Subcutaneous Q24H  . feeding supplement (GLUCERNA SHAKE)  237 mL Oral Q24H  . insulin aspart  0-15 Units Subcutaneous TID WC  . insulin aspart  0-5 Units Subcutaneous QHS  . insulin glargine  20 Units Subcutaneous Daily  . metoCLOPramide (REGLAN) injection  5 mg Intravenous Q8H   Continuous Infusions: . sodium chloride 0.9 % 1,000 mL infusion 125 mL/hr at 05/25/16 0930     LOS: 0 days    Time spent: 35 mins    Aleksa Collinsworth, MD [facility] Pager [phone]  If 7PM-7AM,  please contact night-coverage [email or web address] Password [email or web address] 05/25/2016, 12:59 PM

## 2016-05-26 DIAGNOSIS — E118 Type 2 diabetes mellitus with unspecified complications: Secondary | ICD-10-CM

## 2016-05-26 DIAGNOSIS — N12 Tubulo-interstitial nephritis, not specified as acute or chronic: Secondary | ICD-10-CM

## 2016-05-26 DIAGNOSIS — K760 Fatty (change of) liver, not elsewhere classified: Secondary | ICD-10-CM

## 2016-05-26 DIAGNOSIS — F17200 Nicotine dependence, unspecified, uncomplicated: Secondary | ICD-10-CM

## 2016-05-26 LAB — CBC
HEMATOCRIT: 39.4 % (ref 36.0–46.0)
Hemoglobin: 13 g/dL (ref 12.0–15.0)
MCH: 29.5 pg (ref 26.0–34.0)
MCHC: 33 g/dL (ref 30.0–36.0)
MCV: 89.5 fL (ref 78.0–100.0)
PLATELETS: 222 10*3/uL (ref 150–400)
RBC: 4.4 MIL/uL (ref 3.87–5.11)
RDW: 12.6 % (ref 11.5–15.5)
WBC: 6 10*3/uL (ref 4.0–10.5)

## 2016-05-26 LAB — BASIC METABOLIC PANEL
ANION GAP: 4 — AB (ref 5–15)
BUN: 8 mg/dL (ref 6–20)
CO2: 30 mmol/L (ref 22–32)
Calcium: 8.9 mg/dL (ref 8.9–10.3)
Chloride: 105 mmol/L (ref 101–111)
Creatinine, Ser: 0.72 mg/dL (ref 0.44–1.00)
GLUCOSE: 208 mg/dL — AB (ref 65–99)
POTASSIUM: 4.4 mmol/L (ref 3.5–5.1)
Sodium: 139 mmol/L (ref 135–145)

## 2016-05-26 LAB — URINE CULTURE

## 2016-05-26 LAB — GLUCOSE, CAPILLARY
GLUCOSE-CAPILLARY: 191 mg/dL — AB (ref 65–99)
Glucose-Capillary: 207 mg/dL — ABNORMAL HIGH (ref 65–99)

## 2016-05-26 MED ORDER — OXYCODONE HCL 5 MG PO TABS: 5.0000 mg | ORAL_TABLET | ORAL | 0 refills | Status: DC | PRN

## 2016-05-26 MED ORDER — INSULIN ASPART 100 UNIT/ML FLEXPEN: 4.0000 [IU] | PEN_INJECTOR | Freq: Three times a day (TID) | SUBCUTANEOUS | 0 refills | Status: AC

## 2016-05-26 MED ORDER — BLOOD GLUCOSE MONITOR KIT: PACK | 0 refills | Status: AC

## 2016-05-26 MED ORDER — SULFAMETHOXAZOLE-TRIMETHOPRIM 800-160 MG PO TABS: 1.0000 | ORAL_TABLET | Freq: Two times a day (BID) | ORAL | 0 refills | Status: DC

## 2016-05-26 MED ORDER — INSULIN GLARGINE 100 UNIT/ML ~~LOC~~ SOLN
24.0000 [IU] | Freq: Every day | SUBCUTANEOUS | Status: DC
  Administered 2016-05-26: 24 [IU] via SUBCUTANEOUS
  Filled 2016-05-26: qty 0.24

## 2016-05-26 MED ORDER — INSULIN GLARGINE 100 UNIT/ML SOLOSTAR PEN: 24.0000 [IU] | PEN_INJECTOR | Freq: Every day | SUBCUTANEOUS | 0 refills | Status: AC

## 2016-05-26 MED ORDER — INSULIN PEN NEEDLE 32G X 5 MM MISC: 1.0000 | Freq: Four times a day (QID) | 0 refills | Status: AC

## 2016-05-26 MED ORDER — INSULIN ASPART 100 UNIT/ML ~~LOC~~ SOLN
4.0000 [IU] | Freq: Three times a day (TID) | SUBCUTANEOUS | Status: DC
  Administered 2016-05-26 (×2): 4 [IU] via SUBCUTANEOUS

## 2016-05-26 NOTE — Progress Notes (Signed)
Inpatient Diabetes Program Recommendations  AACE/ADA: New Consensus Statement on Inpatient Glycemic Control (2015)  Target Ranges:  Prepandial:   less than 140 mg/dL      Peak postprandial:   less than 180 mg/dL (1-2 hours)      Critically ill patients:  140 - 180 mg/dL   Lab Results  Component Value Date   GLUCAP 207 (H) 05/26/2016   HGBA1C 12.0 (H) 05/24/2016    Review of Glycemic Control  Needs insulin adjustment.  Recommendations: Increase Lantus to 26 units QD. Novolog 6 units tidwc.  Thank you. Ailene Ardshonda Kenitha Glendinning, RD, LDN, CDE Inpatient Diabetes Coordinator [phone]

## 2016-05-26 NOTE — Discharge Summary (Signed)
Physician Discharge Summary  Natalie Brooks EMV:[id number] DOB: [birth year] DOA: 05/23/2016  PCP: Pcp Not In System  Admit date: 05/23/2016 Discharge date: 05/26/2016  Admitted From: Home Disposition:  Home  Recommendations for Outpatient Follow-up:  1. Follow up with PCP as scheduled with [facility] reviewed. Patient last prescribed 2 days of oxycodone (12 tabs) on 2/20  Discharge Condition:Stable CODE STATUS:Full Diet recommendation: Diabetic   Brief/Interim Summary: 46 year old female with history of diabetes, obesity, hepatic steatosis presented to the ED with right-sided/right flank pain, abdominal pain, nausea over the past 5 days prior to admission. Patient did state having treated for UTI off and on over the last several weeks. Patient with decreased oral intake. Patient admitted placed empirically on IV Rocephin. Urine cultures pending.  #1 acute pyelonephritis/Klebsiella pneumoniae UTI Patient with complaints of right-sided/right flank pain. Patient now with bilious emesis. Patient states antiemetics do not last long enough. Patient denies any dysuria. Urinalysis consistent with a UTI with positive nitrites, too numerous to count WBCs, specific gravity 1.038. Urine cultures preliminary with Klebsiella pneumonia UTI. Sensitivities pending. Continue empiric IV Rocephin. Place patient on a clear liquid diet. Urine cultures later returned multidrug sensitive klebsiella. Renal function stable. No allergies to sulfur listed. Will complete course with bactrim on discharge  #2 diabetes mellitus Hemoglobin A1c is 12.0. CBGs have ranged from 184-248. Place patient on Lantus, dose increased to 24 units with addition of aspart 4 units before meals. Discussed with diabetic coordinator. Will have patient follow up closely with PCP  #3 hepatic steatosis Per CT done a few days prior to admission. Images and results reviewed. LFTs within normal limits. Outpatient  follow-up.  #4 obesity BMI of 35.6.  Nutritional consultation. Outpatient follow-up.  Discharge Diagnoses:  Principal Problem:   Pyelonephritis Active Problems:   Diabetes mellitus without complication (HCC)   Abdominal pain   Obesity   Hepatic steatosis   Tobacco use disorder   Diabetes mellitus with complication [facility])    Discharge Instructions   Allergies as of 05/26/2016      Reactions   Fentanyl Itching   Penicillins Hives   Has patient had a PCN reaction causing immediate rash, facial/tongue/throat swelling, SOB or lightheadedness with hypotension: Yes Has patient had a PCN reaction causing severe rash involving mucus membranes or skin necrosis: No Has patient had a PCN reaction that required hospitalization No Has patient had a PCN reaction occurring within the last 10 years: No If all of the above answers are "NO", then may proceed with Cephalosporin use.   Trimox [amoxicillin] Hives      Medication List    STOP taking these medications   levofloxacin 750 MG tablet Commonly known as:  LEVAQUIN     TAKE these medications   blood glucose meter kit and supplies Kit Dispense based on patient and insurance preference. Use up to four times daily as directed. (FOR ICD-9 250.00, 250.01).   ibuprofen 200 MG tablet Commonly known as:  ADVIL,MOTRIN Take 400-600 mg by mouth every 6 (six) hours as needed (for pain or headaches).   insulin aspart 100 UNIT/ML FlexPen Commonly known as:  NOVOLOG Inject 4 Units into the skin 3 (three) times daily with meals.   Insulin Glargine 100 UNIT/ML Solostar Pen Commonly known as:  LANTUS Inject 24 Units into the skin daily at 10 pm.   Insulin Pen Needle 32G X 5 MM Misc 1 Device by Does not apply route 4 (four) times daily.   oxyCODONE 5 MG immediate  release tablet Commonly known as:  ROXICODONE Take 1 tablet (5 mg total) by mouth every 4 (four) hours as needed for severe pain.   promethazine 25 MG tablet Commonly known as:   PHENERGAN Take 1 tablet (25 mg total) by mouth every 6 (six) hours as needed for nausea or vomiting.   sulfamethoxazole-trimethoprim 800-160 MG tablet Commonly known as:  BACTRIM DS,SEPTRA DS Take 1 tablet by mouth 2 (two) times daily.      Follow-up [facility] Follow up on 05/27/2016.   Why:  post hospital follow up scheduled for 05/27/2016  @ 2:30 pm  Contact information: [place] [phone]         Allergies  Allergen Reactions  . Fentanyl Itching  . Penicillins Hives    Has patient had a PCN reaction causing immediate rash, facial/tongue/throat swelling, SOB or lightheadedness with hypotension: Yes Has patient had a PCN reaction causing severe rash involving mucus membranes or skin necrosis: No Has patient had a PCN reaction that required hospitalization No Has patient had a PCN reaction occurring within the last 10 years: No If all of the above answers are "NO", then may proceed with Cephalosporin use.   . Trimox [Amoxicillin] Hives     Procedures/Studies: Dg Chest 2 View  Result Date: 05/18/2016 CLINICAL DATA:  Chest pain EXAM: CHEST  2 VIEW COMPARISON:  Chest radiograph 02/14/2016 FINDINGS: The lungs are well inflated. Cardiomediastinal contours are normal. No pneumothorax or pleural effusion. No focal airspace consolidation or pulmonary edema. No rib fracture visualized. IMPRESSION: Clear lungs. Electronically Signed   By: Ulyses Jarred M.D.   On: 05/18/2016 06:27   Dg Ribs Unilateral W/chest Right  Result Date: 05/24/2016 CLINICAL DATA:  Right lateral chest pain, rib pain. Right upper quadrant pain. EXAM: RIGHT RIBS AND CHEST - 3+ VIEW COMPARISON:  Chest radiographs and abdominal CT 05/18/2016 FINDINGS: No fracture or other bone lesions are seen involving the ribs. There is no evidence of pneumothorax or pleural effusion. Both lungs are clear. Heart size and mediastinal  contours are within normal limits. IMPRESSION: Negative radiographs of the chest and right ribs. Electronically Signed   By: Jeb Levering M.D.   On: 05/24/2016 00:55   Ct Abdomen Pelvis W Contrast  Result Date: 05/18/2016 CLINICAL DATA:  Pain under RIGHT rib cage and RIGHT upper quadrant for months. Worse in the past 2 weeks. EXAM: CT ABDOMEN AND PELVIS WITH CONTRAST TECHNIQUE: Multidetector CT imaging of the abdomen and pelvis was performed using the standard protocol following bolus administration of intravenous contrast. CONTRAST:  125m ISOVUE-300 IOPAMIDOL (ISOVUE-300) INJECTION 61% COMPARISON:  None. FINDINGS: Lower chest: No acute abnormality. Hepatobiliary: No focal liver abnormality is seen. Steatosis. Status post cholecystectomy. No biliary dilatation. Pancreas: Unremarkable. No pancreatic ductal dilatation or surrounding inflammatory changes. Spleen: Normal in size without focal abnormality. Adrenals/Urinary Tract: Adrenal glands are unremarkable. Kidneys are normal, without renal calculi, focal lesion, or hydronephrosis. Bladder is unremarkable. Stomach/Bowel: Stomach is within normal limits. Appendix appears normal. No evidence of bowel wall thickening, distention, or inflammatory changes. Vascular/Lymphatic: No significant vascular findings are present. No enlarged abdominal or pelvic lymph nodes. Reproductive: Uterus and bilateral adnexa are unremarkable. Other: No abdominal wall hernia or abnormality. No abdominopelvic ascites. Musculoskeletal: No acute or significant osseous findings. IMPRESSION: Hepatic steatosis. Status post cholecystectomy. No acute abnormalities seen to explain the patient's reported symptoms. Electronically Signed   By: JRoderic OvensD.  On: 05/18/2016 08:31    Subjective: No complaints  Discharge Exam: Vitals:   05/26/16 0639 05/26/16 1411  BP: (!) 114/56 123/72  Pulse: 62 71  Resp: 18 16  Temp: 98.1 F (36.7 C) 97.8 F (36.6 C)   Vitals:   05/25/16  1500 05/25/16 2302 05/26/16 0639 05/26/16 1411  BP: 111/71 111/69 (!) 114/56 123/72  Pulse: 80 71 62 71  Resp: _0 Temp: 98.3 F (36.8 C) 98.2 F (36.8 C) 98.1 F (36.7 C) 97.8 F (36.6 C)  TempSrc: Oral Oral Oral Oral  SpO2: 96% 94% 97% 98%  Weight:      Height:        General: Pt is alert, awake, not in acute distress Cardiovascular: RRR, S1/S2 +, no rubs, no gallops Respiratory: CTA bilaterally, no wheezing, no rhonchi Abdominal: Soft, NT, ND, bowel sounds + Extremities: no edema, no cyanosis   The results of significant diagnostics from this hospitalization (including imaging, microbiology, ancillary and laboratory) are listed below for reference.     Microbiology: Recent Results (from the past 240 hour(s))  Urine culture     Status: Abnormal   Collection Time: 05/18/16  5:50 AM  Result Value Ref Range Status   Specimen Description URINE, CLEAN CATCH  Final   Special Requests NONE  Final   Culture >=100,000 COLONIES/mL KLEBSIELLA PNEUMONIAE (A)  Final   Report Status 05/20/2016 FINAL  Final   Organism ID, Bacteria KLEBSIELLA PNEUMONIAE (A)  Final      Susceptibility   Klebsiella pneumoniae - MIC*    AMPICILLIN RESISTANT Resistant     CEFAZOLIN <=4 SENSITIVE Sensitive     CEFTRIAXONE <=1 SENSITIVE Sensitive     CIPROFLOXACIN <=0.25 SENSITIVE Sensitive     GENTAMICIN <=1 SENSITIVE Sensitive     IMIPENEM <=0.25 SENSITIVE Sensitive     NITROFURANTOIN 64 INTERMEDIATE Intermediate     TRIMETH/SULFA <=20 SENSITIVE Sensitive     AMPICILLIN/SULBACTAM 4 SENSITIVE Sensitive     PIP/TAZO <=4 SENSITIVE Sensitive     Extended ESBL NEGATIVE Sensitive     * >=100,000 COLONIES/mL KLEBSIELLA PNEUMONIAE  Urine culture     Status: Abnormal   Collection Time: 05/24/16  2:32 AM  Result Value Ref Range Status   Specimen Description URINE, RANDOM  Final   Special Requests NONE  Final   Culture >=100,000 COLONIES/mL KLEBSIELLA PNEUMONIAE (A)  Final   Report Status  05/26/2016 FINAL  Final   Organism ID, Bacteria KLEBSIELLA PNEUMONIAE (A)  Final      Susceptibility   Klebsiella pneumoniae - MIC*    AMPICILLIN >=32 RESISTANT Resistant     CEFAZOLIN <=4 SENSITIVE Sensitive     CEFTRIAXONE <=1 SENSITIVE Sensitive     CIPROFLOXACIN <=0.25 SENSITIVE Sensitive     GENTAMICIN <=1 SENSITIVE Sensitive     IMIPENEM <=0.25 SENSITIVE Sensitive     NITROFURANTOIN 64 INTERMEDIATE Intermediate     TRIMETH/SULFA <=20 SENSITIVE Sensitive     AMPICILLIN/SULBACTAM 4 SENSITIVE Sensitive     PIP/TAZO <=4 SENSITIVE Sensitive     Extended ESBL NEGATIVE Sensitive     * >=100,000 COLONIES/mL KLEBSIELLA PNEUMONIAE     Labs: BNP (last 3 results) No results for input(s): BNP in the last 8760 hours. Basic Metabolic Panel:  Recent Labs Lab 05/24/16 0108 05/25/16 1130 05/26/16 0521  NA 136 135 139  K 3.5 4.6 4.4  CL 102 101 105  CO2 _1 GLUCOSE 300* 245* 208*  BUN 15 8 8  CREATININE 0.87 0.66 0.72  CALCIUM 9.5 8.8* 8.9   Liver Function Tests:  Recent Labs Lab 05/24/16 0108  AST 19  ALT 33  ALKPHOS 81  BILITOT 0.3  PROT 7.4  ALBUMIN 4.0    Recent Labs Lab 05/24/16 0108  LIPASE 24   No results for input(s): AMMONIA in the last 168 hours. CBC:  Recent Labs Lab 05/24/16 0108 05/25/16 0623 05/26/16 0521  WBC 8.5 6.8 6.0  NEUTROABS 4.4  --   --   HGB 14.9 13.5 13.0  HCT 42.6 40.9 39.4  MCV 88.6 90.7 89.5  PLT 287 205 222   Cardiac Enzymes:  Recent Labs Lab 05/24/16 0108  TROPONINI <0.03   BNP: Invalid input(s): POCBNP CBG:  Recent Labs Lab 05/25/16 1219 05/25/16 1718 05/25/16 2300 05/26/16 0756 05/26/16 1215  GLUCAP 248* 184* 201* 207* 191*   D-Dimer  Recent Labs  05/24/16 0108  DDIMER 0.34   Hgb A1c  Recent Labs  05/24/16 0751  HGBA1C 12.0*   Lipid Profile No results for input(s): CHOL, HDL, LDLCALC, TRIG, CHOLHDL, LDLDIRECT in the last 72 hours. Thyroid function studies No results for input(s): TSH,  T4TOTAL, T3FREE, THYROIDAB in the last 72 hours.  Invalid input(s): FREET3 Anemia work up No results for input(s): VITAMINB12, FOLATE, FERRITIN, TIBC, IRON, RETICCTPCT in the last 72 hours. Urinalysis    Component Value Date/Time   COLORURINE YELLOW 05/24/2016 0232   APPEARANCEUR CLEAR 05/24/2016 0232   LABSPEC 1.038 (H) 05/24/2016 0232   PHURINE 5.0 05/24/2016 0232   GLUCOSEU >=500 (A) 05/24/2016 0232   HGBUR NEGATIVE 05/24/2016 0232   BILIRUBINUR NEGATIVE 05/24/2016 0232   KETONESUR NEGATIVE 05/24/2016 0232   PROTEINUR NEGATIVE 05/24/2016 0232   NITRITE POSITIVE (A) 05/24/2016 0232   LEUKOCYTESUR NEGATIVE 05/24/2016 0232   Sepsis Labs Invalid input(s): PROCALCITONIN,  WBC,  LACTICIDVEN Microbiology Recent Results (from the past 240 hour(s))  Urine culture     Status: Abnormal   Collection Time: 05/18/16  5:50 AM  Result Value Ref Range Status   Specimen Description URINE, CLEAN CATCH  Final   Special Requests NONE  Final   Culture >=100,000 COLONIES/mL KLEBSIELLA PNEUMONIAE (A)  Final   Report Status 05/20/2016 FINAL  Final   Organism ID, Bacteria KLEBSIELLA PNEUMONIAE (A)  Final      Susceptibility   Klebsiella pneumoniae - MIC*    AMPICILLIN RESISTANT Resistant     CEFAZOLIN <=4 SENSITIVE Sensitive     CEFTRIAXONE <=1 SENSITIVE Sensitive     CIPROFLOXACIN <=0.25 SENSITIVE Sensitive     GENTAMICIN <=1 SENSITIVE Sensitive     IMIPENEM <=0.25 SENSITIVE Sensitive     NITROFURANTOIN 64 INTERMEDIATE Intermediate     TRIMETH/SULFA <=20 SENSITIVE Sensitive     AMPICILLIN/SULBACTAM 4 SENSITIVE Sensitive     PIP/TAZO <=4 SENSITIVE Sensitive     Extended ESBL NEGATIVE Sensitive     * >=100,000 COLONIES/mL KLEBSIELLA PNEUMONIAE  Urine culture     Status: Abnormal   Collection Time: 05/24/16  2:32 AM  Result Value Ref Range Status   Specimen Description URINE, RANDOM  Final   Special Requests NONE  Final   Culture >=100,000 COLONIES/mL KLEBSIELLA PNEUMONIAE (A)  Final    Report Status 05/26/2016 FINAL  Final   Organism ID, Bacteria KLEBSIELLA PNEUMONIAE (A)  Final      Susceptibility   Klebsiella pneumoniae - MIC*    AMPICILLIN >=32 RESISTANT Resistant     CEFAZOLIN <=4 SENSITIVE Sensitive     CEFTRIAXONE <=1 SENSITIVE  Sensitive     CIPROFLOXACIN <=0.25 SENSITIVE Sensitive     GENTAMICIN <=1 SENSITIVE Sensitive     IMIPENEM <=0.25 SENSITIVE Sensitive     NITROFURANTOIN 64 INTERMEDIATE Intermediate     TRIMETH/SULFA <=20 SENSITIVE Sensitive     AMPICILLIN/SULBACTAM 4 SENSITIVE Sensitive     PIP/TAZO <=4 SENSITIVE Sensitive     Extended ESBL NEGATIVE Sensitive     * >=100,000 COLONIES/mL KLEBSIELLA PNEUMONIAE     SIGNED:   Quorra Rosene, Orpah Melter, MD  [facility] 05/26/2016, 3:57 PM  If 7PM-7AM, please contact night-coverage [email or web address] Password [email or web address]

## 2016-05-26 NOTE — Progress Notes (Signed)
Patient is being discharged home. Discharge instructions were given to family and patient

## 2016-05-27 ENCOUNTER — Inpatient Hospital Stay: Payer: Managed Care, Other (non HMO)

## 2016-08-20 ENCOUNTER — Emergency Department (HOSPITAL_BASED_OUTPATIENT_CLINIC_OR_DEPARTMENT_OTHER)
Admission: EM | Admit: 2016-08-20 | Discharge: 2016-08-20 | Disposition: A | Discharge status: Home / Self Care | Payer: Managed Care, Other (non HMO) | Attending: Emergency Medicine | Admitting: Emergency Medicine

## 2016-08-20 ENCOUNTER — Encounter (HOSPITAL_BASED_OUTPATIENT_CLINIC_OR_DEPARTMENT_OTHER): Payer: Self-pay

## 2016-08-20 DIAGNOSIS — L03311 Cellulitis of abdominal wall: Secondary | ICD-10-CM | POA: Diagnosis not present

## 2016-08-20 DIAGNOSIS — Z794 Long term (current) use of insulin: Secondary | ICD-10-CM | POA: Insufficient documentation

## 2016-08-20 DIAGNOSIS — Y939 Activity, unspecified: Secondary | ICD-10-CM | POA: Insufficient documentation

## 2016-08-20 DIAGNOSIS — W57XXXA Bitten or stung by nonvenomous insect and other nonvenomous arthropods, initial encounter: Secondary | ICD-10-CM | POA: Insufficient documentation

## 2016-08-20 DIAGNOSIS — S30861A Insect bite (nonvenomous) of abdominal wall, initial encounter: Secondary | ICD-10-CM | POA: Diagnosis present

## 2016-08-20 DIAGNOSIS — Y999 Unspecified external cause status: Secondary | ICD-10-CM | POA: Diagnosis not present

## 2016-08-20 DIAGNOSIS — E119 Type 2 diabetes mellitus without complications: Secondary | ICD-10-CM | POA: Diagnosis not present

## 2016-08-20 DIAGNOSIS — Y92838 Other recreation area as the place of occurrence of the external cause: Secondary | ICD-10-CM | POA: Diagnosis not present

## 2016-08-20 LAB — CBG MONITORING, ED: Glucose-Capillary: 280 mg/dL — ABNORMAL HIGH (ref 65–99)

## 2016-08-20 MED ORDER — CLINDAMYCIN HCL 300 MG PO CAPS: 300.0000 mg | ORAL_CAPSULE | Freq: Four times a day (QID) | ORAL | 0 refills | Status: DC

## 2016-08-20 MED ORDER — CLINDAMYCIN HCL 150 MG PO CAPS
300.0000 mg | ORAL_CAPSULE | Freq: Once | ORAL | Status: AC
  Administered 2016-08-20: 300 mg via ORAL
  Filled 2016-08-20: qty 2

## 2016-08-20 NOTE — ED Provider Notes (Signed)
[facility] DEPT Provider Note   CSN: [id number] Arrival date & time: 08/20/16  1914   By signing my name below, I, Janina Mayo and Hansel Feinstein, attest that this documentation has been prepared under the direction and in the presence of Julianne Rice, MD . Electronically Signed: Janina Mayo and Hansel Feinstein, Scribe. 08/20/2016. 8:47 PM.   History   Chief Complaint Chief Complaint  Patient presents with  . Insect Bite     The history is provided by the patient. No language interpreter was used.    HPI Comments:  Natalie Brooks is a 46 y.o. female with h/o DM presenting to the ED complaining of a moderate, gradually worsening area of pain, redness and swelling to the right upper abdomen that began 2 days ago. Pt believes the area originated as an insect bite, but is not sure as she did not see or feel a bite. No one else in her household has similar symptoms. Pt reports her CBGs are not ideally controlled at the moment due to a lapse in her prescription and difficulty taking the readings from her fingertips. Pt denies fever, nausea, vomiting, diarrhea, chills, drainage from the area, additional similar areas elsewhere on the body. No other alleviating factors noted.   Past Medical History:  Diagnosis Date  . Diabetes mellitus without complication ([place])   . Hepatic steatosis   . Obesity   . Pyelonephritis   . Tobacco abuse     Patient Active Problem List   Diagnosis Date Noted  . Pyelonephritis 05/24/2016  . Abdominal pain 05/24/2016  . Obesity 05/24/2016  . Hepatic steatosis 05/24/2016  . Tobacco use disorder 05/24/2016  . Diabetes mellitus without complication ([place])   . Diabetes mellitus with complication [facility])     Past Surgical History:  Procedure Laterality Date  . CHOLECYSTECTOMY    . JOINT REPLACEMENT      OB History    No data available       Home Medications    Prior to Admission medications   Medication Sig Start Date End Date Taking?  Authorizing Provider  blood glucose meter kit and supplies KIT Dispense based on patient and insurance preference. Use up to four times daily as directed. (FOR ICD-9 250.00, 250.01). 05/26/16   Donne Hazel, MD  clindamycin (CLEOCIN) 300 MG capsule Take 1 capsule (300 mg total) by mouth 4 (four) times daily. X 7 days 08/21/16   Julianne Rice, MD  insulin aspart (NOVOLOG) 100 UNIT/ML FlexPen Inject 4 Units into the skin 3 (three) times daily with meals. 05/26/16   Donne Hazel, MD  Insulin Glargine (LANTUS) 100 UNIT/ML Solostar Pen Inject 24 Units into the skin daily at 10 pm. 05/26/16   Donne Hazel, MD  Insulin Pen Needle 32G X 5 MM MISC 1 Device by Does not apply route 4 (four) times daily. 05/26/16   Donne Hazel, MD  sulfamethoxazole-trimethoprim (BACTRIM DS,SEPTRA DS) 800-160 MG tablet Take 1 tablet by mouth 2 (two) times daily. 05/26/16   Donne Hazel, MD    Family History Family History  Problem Relation Age of Onset  . Breast cancer Mother   . Diabetes Mother   . Diabetes Sister   . Diabetes Brother     Social History Social History  Substance Use Topics  . Smoking status: Current Every Day Smoker    Packs/day: 1.00    Types: Cigarettes  . Smokeless tobacco: Never Used  . Alcohol use No     Allergies  Fentanyl; Penicillins; and Trimox [amoxicillin]   Review of Systems Review of Systems  Constitutional: Negative for chills and fever.  Respiratory: Negative for shortness of breath.   Cardiovascular: Negative for chest pain.  Gastrointestinal: Positive for abdominal pain. Negative for nausea and vomiting.  Musculoskeletal: Negative for back pain and myalgias.  Skin: Positive for color change and wound.  Neurological: Negative for dizziness, weakness, light-headedness, numbness and headaches.  All other systems reviewed and are negative.    Physical Exam Updated Vital Signs BP 127/65 (BP Location: Left Arm)   Pulse (!) 104   Temp 98.9 F (37.2 C)  (Oral)   Resp 16   Ht '5\' 11"'  (1.803 m)   Wt 111.6 kg (246 lb)   LMP 06/01/2016   SpO2 98%   BMI 34.31 kg/m   Physical Exam  Constitutional: She is oriented to person, place, and time. She appears well-developed and well-nourished.  HENT:  Head: Normocephalic and atraumatic.  Mouth/Throat: Oropharynx is clear and moist.  Eyes: EOM are normal. Pupils are equal, round, and reactive to light.  Neck: Normal range of motion. Neck supple.  Cardiovascular: Normal rate and regular rhythm.   Pulmonary/Chest: Effort normal and breath sounds normal.  Abdominal: Soft. Bowel sounds are normal. There is tenderness. There is no rebound and no guarding.  Patient has a pustule in the right upper abdominal wall with surrounding erythema. Roughly 5-6 cm in diameter. There is induration but no evidence of fluctuance.  Musculoskeletal: Normal range of motion. She exhibits no edema or tenderness.  Neurological: She is alert and oriented to person, place, and time.  Skin: Skin is warm and dry. No rash noted. No erythema.  Psychiatric: She has a normal mood and affect. Her behavior is normal.  Nursing note and vitals reviewed.    ED Treatments / Results  DIAGNOSTIC STUDIES:  Oxygen Saturation is 98% on RA, normal by my interpretation.    COORDINATION OF CARE:  8:45 PM Discussed treatment plan with pt at bedside and pt agreed to plan.  Labs (all labs ordered are listed, but only abnormal results are displayed) Labs Reviewed  CBG MONITORING, ED - Abnormal; Notable for the following:       Result Value   Glucose-Capillary 280 (*)    All other components within normal limits    EKG  EKG Interpretation None       Radiology No results found.  Procedures Procedures (including critical care time)  Medications Ordered in ED Medications  clindamycin (CLEOCIN) capsule 300 mg (300 mg Oral Given 08/20/16 2053)     Initial Impression / Assessment and Plan / ED Course  I have reviewed the  triage vital signs and the nursing notes.  Pertinent labs & imaging results that were available during my care of the patient were reviewed by me and considered in my medical decision making (see chart for details).     Patient very well-appearing. Given dose of antibiotics in the emergency department and will discharge with oral antibiotics. She understands the need to return immediately for any worsening of her symptoms. She's been instructed to return in 2 days for reevaluation of the wound. Patient voices understanding.  Final Clinical Impressions(s) / ED Diagnoses   Final diagnoses:  Cellulitis of abdominal wall    New Prescriptions Discharge Medication List as of 08/20/2016  8:59 PM    START taking these medications   Details  clindamycin (CLEOCIN) 300 MG capsule Take 1 capsule (300 mg total) by mouth 4 (  four) times daily. X 7 days, Starting Sat 08/21/2016, Print      I personally performed the services described in this documentation, which was scribed in my presence. The recorded information has been reviewed and is accurate.       Julianne Rice, MD 08/23/16 2118

## 2016-08-20 NOTE — ED Triage Notes (Signed)
C/o ?insect bite to right uppper abd x 2 days-NAD-steady gait-texting

## 2017-09-14 ENCOUNTER — Other Ambulatory Visit: Payer: Self-pay

## 2017-09-14 ENCOUNTER — Encounter (HOSPITAL_BASED_OUTPATIENT_CLINIC_OR_DEPARTMENT_OTHER): Payer: Self-pay

## 2017-09-14 ENCOUNTER — Emergency Department (HOSPITAL_BASED_OUTPATIENT_CLINIC_OR_DEPARTMENT_OTHER)
Admission: EM | Admit: 2017-09-14 | Discharge: 2017-09-15 | Disposition: A | Discharge status: Home / Self Care | Payer: Managed Care, Other (non HMO) | Attending: Emergency Medicine | Admitting: Emergency Medicine

## 2017-09-14 DIAGNOSIS — Z794 Long term (current) use of insulin: Secondary | ICD-10-CM | POA: Diagnosis not present

## 2017-09-14 DIAGNOSIS — L0231 Cutaneous abscess of buttock: Secondary | ICD-10-CM | POA: Insufficient documentation

## 2017-09-14 DIAGNOSIS — Z9049 Acquired absence of other specified parts of digestive tract: Secondary | ICD-10-CM | POA: Diagnosis not present

## 2017-09-14 DIAGNOSIS — F17210 Nicotine dependence, cigarettes, uncomplicated: Secondary | ICD-10-CM | POA: Insufficient documentation

## 2017-09-14 DIAGNOSIS — E119 Type 2 diabetes mellitus without complications: Secondary | ICD-10-CM | POA: Insufficient documentation

## 2017-09-14 DIAGNOSIS — R222 Localized swelling, mass and lump, trunk: Secondary | ICD-10-CM | POA: Diagnosis present

## 2017-09-14 MED ORDER — LORAZEPAM 1 MG PO TABS
1.0000 mg | ORAL_TABLET | Freq: Once | ORAL | Status: AC
  Administered 2017-09-14: 1 mg via ORAL
  Filled 2017-09-14: qty 1

## 2017-09-14 MED ORDER — LIDOCAINE HCL (PF) 1 % IJ SOLN
5.0000 mL | Freq: Once | INTRAMUSCULAR | Status: AC
  Administered 2017-09-14: 5 mL via INTRADERMAL
  Filled 2017-09-14: qty 5

## 2017-09-14 NOTE — ED Provider Notes (Signed)
[facility] EMERGENCY DEPARTMENT Provider Note   CSN: [id number] Arrival date & time: 09/14/17  1951     History   Chief Complaint Chief Complaint  Patient presents with  . Abscess    HPI Natalie Brooks is a 47 y.o. female.  Patient is a 47 year old female with past medical history of diabetes.  She presents with complaints of swelling, redness, and pain to her left buttock.  This is been present for the past 3 to 4 months, however has significantly worsened over the past few days.  It began draining this evening.  She denies any fevers or chills.  The history is provided by the patient.  Abscess  Abscess location: Left buttock. Size:  5x6 Abscess quality: fluctuance, induration, painful and redness   Abscess quality: not draining   Progression:  Worsening Pain details:    Quality:  Throbbing   Severity:  Severe   Timing:  Constant   Progression:  Worsening Context: diabetes     Past Medical History:  Diagnosis Date  . Diabetes mellitus without complication ([place])   . Hepatic steatosis   . Obesity   . Pyelonephritis   . Tobacco abuse     Patient Active Problem List   Diagnosis Date Noted  . Pyelonephritis 05/24/2016  . Abdominal pain 05/24/2016  . Obesity 05/24/2016  . Hepatic steatosis 05/24/2016  . Tobacco use disorder 05/24/2016  . Diabetes mellitus without complication ([place])   . Diabetes mellitus with complication [facility])     Past Surgical History:  Procedure Laterality Date  . CHOLECYSTECTOMY    . JOINT REPLACEMENT       OB History   None      Home Medications    Prior to Admission medications   Medication Sig Start Date End Date Taking? Authorizing Provider  blood glucose meter kit and supplies KIT Dispense based on patient and insurance preference. Use up to four times daily as directed. (FOR ICD-9 250.00, 250.01). 05/26/16   Donne Hazel, MD  clindamycin (CLEOCIN) 300 MG capsule Take 1 capsule (300 mg total) by mouth 4  (four) times daily. X 7 days 08/21/16   Julianne Rice, MD  insulin aspart (NOVOLOG) 100 UNIT/ML FlexPen Inject 4 Units into the skin 3 (three) times daily with meals. 05/26/16   Donne Hazel, MD  Insulin Glargine (LANTUS) 100 UNIT/ML Solostar Pen Inject 24 Units into the skin daily at 10 pm. 05/26/16   Donne Hazel, MD  Insulin Pen Needle 32G X 5 MM MISC 1 Device by Does not apply route 4 (four) times daily. 05/26/16   Donne Hazel, MD  sulfamethoxazole-trimethoprim (BACTRIM DS,SEPTRA DS) 800-160 MG tablet Take 1 tablet by mouth 2 (two) times daily. 05/26/16   Donne Hazel, MD    Family History Family History  Problem Relation Age of Onset  . Breast cancer Mother   . Diabetes Mother   . Diabetes Sister   . Diabetes Brother     Social History Social History   Tobacco Use  . Smoking status: Current Every Day Smoker    Packs/day: 1.00    Types: Cigarettes  . Smokeless tobacco: Never Used  Substance Use Topics  . Alcohol use: No  . Drug use: No     Allergies   Fentanyl; Penicillins; and Trimox [amoxicillin]   Review of Systems Review of Systems  All other systems reviewed and are negative.    Physical Exam Updated Vital Signs BP 104/74 (BP Location: Left Arm)  Pulse 90   Temp 99.2 F (37.3 C) (Oral)   Resp 20   Ht 6' (1.829 m)   Wt 105.2 kg (232 lb)   LMP 06/01/2016   SpO2 97%   BMI 31.46 kg/m   Physical Exam  Constitutional: She appears well-developed and well-nourished. No distress.  HENT:  Head: Normocephalic and atraumatic.  Neck: Normal range of motion. Neck supple.  Pulmonary/Chest: Effort normal.  Neurological: She is alert.  Skin: Skin is warm and dry. She is not diaphoretic.  The left buttock has a 5 cm x 6 cm indurated, swollen, tender area with surrounding erythema and warmth.  Nursing note and vitals reviewed.    ED Treatments / Results  Labs (all labs ordered are listed, but only abnormal results are displayed) Labs Reviewed -  No data to display  EKG None  Radiology No results found.  Procedures Procedures (including critical care time)  Medications Ordered in ED Medications - No data to display   Initial Impression / Assessment and Plan / ED Course  I have reviewed the triage vital signs and the nursing notes.  Pertinent labs & imaging results that were available during my care of the patient were reviewed by me and considered in my medical decision making (see chart for details).  INCISION AND DRAINAGE Performed by: Veryl Speak Consent: Verbal consent obtained. Risks and benefits: risks, benefits and alternatives were discussed Type: abscess  Body area: left buttock  Anesthesia: local infiltration  Incision was made with a scalpel.  Local anesthetic: lidocaine 1% without epinephrine  Anesthetic total: 4 ml  Complexity: complex Blunt dissection to break up loculations  Drainage: purulent  Drainage amount: moderate  Packing material: no packing placed  Patient tolerance: Patient tolerated the procedure well with no immediate complications.   Abscess incised and drained as per procedure note.  She will be given Keflex and Bactrim along with medicine for pain.  She is to perform warm soaks or sitz baths as frequently as possible for the next several days.  She is to return as needed for any problems.  Final Clinical Impressions(s) / ED Diagnoses   Final diagnoses:  None    ED Discharge Orders    None       Veryl Speak, MD 09/15/17 [phone]

## 2017-09-14 NOTE — ED Notes (Addendum)
Updated pt to plan of care and wait time 

## 2017-09-14 NOTE — ED Triage Notes (Signed)
Pt has abscess to left buttock for the last three days, pt unsure if she has had a fever, last took ibuprofen this morning

## 2017-09-14 NOTE — ED Notes (Signed)
Patient c/o abscess to left buttock X 4 months; minimal brown to yellow drainage noted to bandage; patient denies any fevers.

## 2017-09-15 MED ORDER — OXYCODONE-ACETAMINOPHEN 5-325 MG PO TABS
1.0000 | ORAL_TABLET | Freq: Once | ORAL | Status: AC
  Administered 2017-09-15: 1 via ORAL

## 2017-09-15 MED ORDER — OXYCODONE-ACETAMINOPHEN 5-325 MG PO TABS
ORAL_TABLET | ORAL | Status: AC
  Filled 2017-09-15: qty 1

## 2017-09-15 MED ORDER — OXYCODONE-ACETAMINOPHEN 5-325 MG PO TABS: 1.0000 | ORAL_TABLET | Freq: Four times a day (QID) | ORAL | 0 refills | Status: DC | PRN

## 2017-09-15 MED ORDER — SULFAMETHOXAZOLE-TRIMETHOPRIM 800-160 MG PO TABS: 1.0000 | ORAL_TABLET | Freq: Two times a day (BID) | ORAL | 0 refills | Status: AC

## 2017-09-15 MED ORDER — CEPHALEXIN 500 MG PO CAPS: 500.0000 mg | ORAL_CAPSULE | Freq: Four times a day (QID) | ORAL | 0 refills | Status: DC

## 2017-09-15 NOTE — Discharge Instructions (Addendum)
Keflex and Bactrim as prescribed.  Percocet as prescribed as needed for pain.  Apply warm compresses or perform sitz baths as frequently as possible for the next several days.  Return to the emergency department if symptoms greatly worsen or change

## 2018-03-15 ENCOUNTER — Other Ambulatory Visit: Payer: Self-pay

## 2018-03-15 ENCOUNTER — Emergency Department (HOSPITAL_BASED_OUTPATIENT_CLINIC_OR_DEPARTMENT_OTHER): Payer: Managed Care, Other (non HMO)

## 2018-03-15 ENCOUNTER — Encounter (HOSPITAL_BASED_OUTPATIENT_CLINIC_OR_DEPARTMENT_OTHER): Payer: Self-pay | Admitting: *Deleted

## 2018-03-15 ENCOUNTER — Emergency Department (HOSPITAL_BASED_OUTPATIENT_CLINIC_OR_DEPARTMENT_OTHER)
Admission: EM | Admit: 2018-03-15 | Discharge: 2018-03-15 | Disposition: A | Discharge status: Home / Self Care | Payer: Managed Care, Other (non HMO) | Attending: Emergency Medicine | Admitting: Emergency Medicine

## 2018-03-15 DIAGNOSIS — E119 Type 2 diabetes mellitus without complications: Secondary | ICD-10-CM | POA: Insufficient documentation

## 2018-03-15 DIAGNOSIS — Z79899 Other long term (current) drug therapy: Secondary | ICD-10-CM | POA: Insufficient documentation

## 2018-03-15 DIAGNOSIS — F17210 Nicotine dependence, cigarettes, uncomplicated: Secondary | ICD-10-CM | POA: Diagnosis not present

## 2018-03-15 DIAGNOSIS — M7918 Myalgia, other site: Secondary | ICD-10-CM | POA: Diagnosis present

## 2018-03-15 DIAGNOSIS — Z9660 Presence of unspecified orthopedic joint implant: Secondary | ICD-10-CM | POA: Insufficient documentation

## 2018-03-15 DIAGNOSIS — Z794 Long term (current) use of insulin: Secondary | ICD-10-CM | POA: Insufficient documentation

## 2018-03-15 MED ORDER — CYCLOBENZAPRINE HCL 10 MG PO TABS: 10.0000 mg | ORAL_TABLET | Freq: Two times a day (BID) | ORAL | 0 refills | Status: AC | PRN

## 2018-03-15 MED ORDER — NAPROXEN 500 MG PO TABS: 500.0000 mg | ORAL_TABLET | Freq: Two times a day (BID) | ORAL | 0 refills | Status: AC

## 2018-03-15 MED ORDER — CYCLOBENZAPRINE HCL 10 MG PO TABS
10.0000 mg | ORAL_TABLET | Freq: Once | ORAL | Status: AC
  Administered 2018-03-15: 10 mg via ORAL
  Filled 2018-03-15: qty 1
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
## 2018-03-15 NOTE — Discharge Instructions (Addendum)
Follow up with your doctor for recheck. Warm compresses to sore muscles for 20 minutes at a time. Take Flexeril and Naproxen as needed as prescribed. Do not drive or operate machinery while taking Flexeril.
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
## 2021-01-10 ENCOUNTER — Emergency Department (HOSPITAL_COMMUNITY): Payer: 59

## 2021-01-10 ENCOUNTER — Emergency Department (HOSPITAL_COMMUNITY)
Admission: EM | Admit: 2021-01-10 | Discharge: 2021-01-10 | Disposition: A | Discharge status: Home / Self Care | Payer: 59 | Attending: Emergency Medicine | Admitting: Emergency Medicine

## 2021-01-10 DIAGNOSIS — F17210 Nicotine dependence, cigarettes, uncomplicated: Secondary | ICD-10-CM | POA: Diagnosis not present

## 2021-01-10 DIAGNOSIS — S42294A Other nondisplaced fracture of upper end of right humerus, initial encounter for closed fracture: Secondary | ICD-10-CM | POA: Diagnosis not present

## 2021-01-10 DIAGNOSIS — W108XXA Fall (on) (from) other stairs and steps, initial encounter: Secondary | ICD-10-CM | POA: Diagnosis not present

## 2021-01-10 DIAGNOSIS — W19XXXA Unspecified fall, initial encounter: Secondary | ICD-10-CM

## 2021-01-10 DIAGNOSIS — M25511 Pain in right shoulder: Secondary | ICD-10-CM

## 2021-01-10 DIAGNOSIS — E119 Type 2 diabetes mellitus without complications: Secondary | ICD-10-CM | POA: Diagnosis not present

## 2021-01-10 DIAGNOSIS — S4991XA Unspecified injury of right shoulder and upper arm, initial encounter: Secondary | ICD-10-CM | POA: Diagnosis present

## 2021-01-10 DIAGNOSIS — R52 Pain, unspecified: Secondary | ICD-10-CM

## 2021-01-10 MED ORDER — IBUPROFEN 800 MG PO TABS
800.0000 mg | ORAL_TABLET | Freq: Once | ORAL | Status: AC
  Administered 2021-01-10: 800 mg via ORAL
  Filled 2021-01-10: qty 1

## 2021-01-10 MED ORDER — IBUPROFEN 800 MG PO TABS: 800.0000 mg | ORAL_TABLET | Freq: Three times a day (TID) | ORAL | 0 refills | Status: AC | PRN

## 2021-01-10 MED ORDER — HYDROMORPHONE HCL 1 MG/ML IJ SOLN
1.0000 mg | Freq: Once | INTRAMUSCULAR | Status: AC
  Administered 2021-01-10: 1 mg via INTRAVENOUS
  Filled 2021-01-10: qty 1

## 2021-01-10 MED ORDER — DOCUSATE SODIUM 100 MG PO CAPS: 100.0000 mg | ORAL_CAPSULE | Freq: Every day | ORAL | 0 refills | Status: AC | PRN

## 2021-01-10 MED ORDER — ONDANSETRON 4 MG PO TBDP
4.0000 mg | ORAL_TABLET | Freq: Once | ORAL | Status: AC
  Administered 2021-01-10: 4 mg via ORAL
  Filled 2021-01-10: qty 1

## 2021-01-10 MED ORDER — OXYCODONE-ACETAMINOPHEN 5-325 MG PO TABS
1.0000 | ORAL_TABLET | Freq: Once | ORAL | Status: AC
  Administered 2021-01-10: 1 via ORAL
  Filled 2021-01-10: qty 1

## 2021-01-10 MED ORDER — OXYCODONE HCL 5 MG PO TABS: 5.0000 mg | ORAL_TABLET | ORAL | 0 refills | Status: DC | PRN

## 2021-01-10 MED ORDER — ACETAMINOPHEN 325 MG PO TABS: 650.0000 mg | ORAL_TABLET | Freq: Four times a day (QID) | ORAL | 0 refills | Status: AC | PRN

## 2021-01-10 NOTE — ED Notes (Signed)
Placed ice packs on pt to help reduce pain and swelling.

## 2021-01-10 NOTE — ED Provider Notes (Signed)
50 yo female who fell down stairs last night, landing on right shoulder, having arm pain.  Not on A/C, no head trauma.  Pending xrya imaging of upper extremity.  Right humeral neck fx, nondisplaced, neurovascularly intact.  Pain medications given, shoulder immobilizer applied, f/u with orthopedics, work noted provided.  Husband present to take patient home.   Terald Sleeper, MD 01/10/21 [phone]

## 2021-01-10 NOTE — ED Provider Notes (Signed)
[facility]-EMERGENCY DEPT [facility] Emergency Department Provider Note MRN:  [id number]  Arrival date & time: 01/10/21     Chief Complaint   No chief complaint on file.   History of Present Illness   Natalie Brooks is a 50 y.o. year-old female with a history of diabetes presenting to the ED with chief complaint of fall.  Patient stumbled few stairs.  Woke up from sleep and was groggy and was walking around a dark unfamiliar home.  Stumbled and fell.  Landed awkwardly on her right shoulder and having moderate to severe pain in this area.  Pain to the entire right arm, pain with any movement of the arm or fingers.  Denies head trauma, no loss of consciousness, no neck pain, no chest pain or shortness of breath, no back pain, no abdominal pain, mild left knee pain, abrasion to left shin.  Review of Systems  A complete 10 system review of systems was obtained and all systems are negative except as noted in the HPI and PMH.   Patient's Health History    Past Medical History:  Diagnosis Date   Diabetes mellitus without complication ([facility])    Hepatic steatosis    Obesity    Pyelonephritis    Tobacco abuse     Past Surgical History:  Procedure Laterality Date   CHOLECYSTECTOMY     JOINT REPLACEMENT      Family History  Problem Relation Age of Onset   Breast cancer Mother    Diabetes Mother    Diabetes Sister    Diabetes Brother     Social History   Socioeconomic History   Marital status: Married    Spouse name: Not on file   Number of children: Not on file   Years of education: Not on file   Highest education level: Not on file  Occupational History   Not on file  Tobacco Use   Smoking status: Every Day    Packs/day: 1.00    Types: Cigarettes   Smokeless tobacco: Never  Substance and Sexual Activity   Alcohol use: No   Drug use: No   Sexual activity: Not on file  Other Topics Concern   Not on file  Social History Narrative   Not on file   Social Determinants  of Health   Financial Resource Strain: Not on file  Food Insecurity: Not on file  Transportation Needs: Not on file  Physical Activity: Not on file  Stress: Not on file  Social Connections: Not on file  Intimate Partner Violence: Not on file     Physical Exam   Vitals:   01/10/21 0541 01/10/21 0630  BP: (!) 159/83 137/75  Pulse: 78 71  Resp: 18 18  Temp: 98.1 F (36.7 C)   SpO2: 97% 100%    CONSTITUTIONAL: Well-appearing, NAD NEURO:  Alert and oriented x 3, no focal deficits EYES:  eyes equal and reactive ENT/NECK:  no LAD, no JVD CARDIO: Regular rate, well-perfused, normal S1 and S2 PULM:  CTAB no wheezing or rhonchi GI/GU:  normal bowel sounds, non-distended, non-tender MSK/SPINE:  No gross deformities, no edema SKIN:  no rash, atraumatic PSYCH:  Appropriate speech and behavior  *Additional and/or pertinent findings included in MDM below  Diagnostic and Interventional Summary    EKG Interpretation  Date/Time:    Ventricular Rate:    PR Interval:    QRS Duration:   QT Interval:    QTC Calculation:   R Axis:     Text Interpretation:  Labs Reviewed - No data to display  DG Shoulder Right    (Results Pending)  DG Humerus Right    (Results Pending)  DG Elbow Complete Right    (Results Pending)  DG Forearm Right    (Results Pending)  DG Hand Complete Right    (Results Pending)    Medications  HYDROmorphone (DILAUDID) injection 1 mg (1 mg Intravenous Given 01/10/21 9150)     Procedures  /  Critical Care Procedures  ED Course and Medical Decision Making  I have reviewed the triage vital signs, the nursing notes, and pertinent available records from the EMR.  Listed above are laboratory and imaging tests that I personally ordered, reviewed, and interpreted and then considered in my medical decision making (see below for details).  X-ray to evaluate for fracture or dislocation.  Normal neurological exam, no significant head trauma, no nausea  vomiting, other than the right arm a reassuring trauma exam.  Arm is neurovascularly intact.  Signed out to oncoming provider at shift change.       Elmer Sow. Pilar Plate, MD [facility] Emergency Medicine [facility] [email or web address]  Final Clinical Impressions(s) / ED Diagnoses     ICD-10-CM   1. Acute pain of right shoulder  M25.511       ED Discharge Orders     None        Discharge Instructions Discussed with and Provided to Patient:   Discharge Instructions   None       Sabas Sous, MD 01/10/21 [phone]

## 2021-01-10 NOTE — ED Triage Notes (Signed)
Pt fell at daughters house. Complaining of right shoulder pain, right arm pain, right leg and knee pain. Slight swelling to right arm, no deformity noted.

## 2022-02-13 ENCOUNTER — Encounter (HOSPITAL_BASED_OUTPATIENT_CLINIC_OR_DEPARTMENT_OTHER): Payer: Self-pay | Admitting: Emergency Medicine

## 2022-02-13 ENCOUNTER — Emergency Department (HOSPITAL_BASED_OUTPATIENT_CLINIC_OR_DEPARTMENT_OTHER): Payer: Self-pay

## 2022-02-13 ENCOUNTER — Emergency Department (HOSPITAL_BASED_OUTPATIENT_CLINIC_OR_DEPARTMENT_OTHER)
Admission: EM | Admit: 2022-02-13 | Discharge: 2022-02-13 | Disposition: A | Discharge status: Home / Self Care | Payer: Self-pay | Attending: Emergency Medicine | Admitting: Emergency Medicine

## 2022-02-13 DIAGNOSIS — R1011 Right upper quadrant pain: Secondary | ICD-10-CM | POA: Insufficient documentation

## 2022-02-13 DIAGNOSIS — R1013 Epigastric pain: Secondary | ICD-10-CM | POA: Insufficient documentation

## 2022-02-13 DIAGNOSIS — R109 Unspecified abdominal pain: Secondary | ICD-10-CM

## 2022-02-13 DIAGNOSIS — E119 Type 2 diabetes mellitus without complications: Secondary | ICD-10-CM | POA: Insufficient documentation

## 2022-02-13 DIAGNOSIS — Z794 Long term (current) use of insulin: Secondary | ICD-10-CM | POA: Insufficient documentation

## 2022-02-13 DIAGNOSIS — R000 Tachycardia, unspecified: Secondary | ICD-10-CM | POA: Insufficient documentation

## 2022-02-13 LAB — COMPREHENSIVE METABOLIC PANEL
ALT: 18 U/L (ref 0–44)
AST: 16 U/L (ref 15–41)
Albumin: 3.9 g/dL (ref 3.5–5.0)
Alkaline Phosphatase: 75 U/L (ref 38–126)
Anion gap: 9 (ref 5–15)
BUN: 13 mg/dL (ref 6–20)
CO2: 26 mmol/L (ref 22–32)
Calcium: 9.2 mg/dL (ref 8.9–10.3)
Chloride: 100 mmol/L (ref 98–111)
Creatinine, Ser: 0.72 mg/dL (ref 0.44–1.00)
GFR, Estimated: >60 mL/min (ref >60)
Glucose, Bld: 313 mg/dL — ABNORMAL HIGH (ref 70–99)
Potassium: 3.6 mmol/L (ref 3.5–5.1)
Sodium: 135 mmol/L (ref 135–145)
Total Bilirubin: 0.4 mg/dL (ref 0.3–1.2)
Total Protein: 7.8 g/dL (ref 6.5–8.1)

## 2022-02-13 LAB — CBC WITH DIFFERENTIAL/PLATELET
Abs Immature Granulocytes: 0.01 10*3/uL (ref 0.00–0.07)
Basophils Absolute: 0 10*3/uL (ref 0.0–0.1)
Basophils Relative: 0 %
Eosinophils Absolute: 0.2 10*3/uL (ref 0.0–0.5)
Eosinophils Relative: 2 %
HCT: 44.7 % (ref 36.0–46.0)
Hemoglobin: 15.3 g/dL — ABNORMAL HIGH (ref 12.0–15.0)
Immature Granulocytes: 0 %
Lymphocytes Relative: 38 %
Lymphs Abs: 3 10*3/uL (ref 0.7–4.0)
MCH: 29.8 pg (ref 26.0–34.0)
MCHC: 34.2 g/dL (ref 30.0–36.0)
MCV: 87.1 fL (ref 80.0–100.0)
Monocytes Absolute: 0.4 10*3/uL (ref 0.1–1.0)
Monocytes Relative: 5 %
Neutro Abs: 4.3 10*3/uL (ref 1.7–7.7)
Neutrophils Relative %: 55 %
Platelets: 327 10*3/uL (ref 150–400)
RBC: 5.13 MIL/uL — ABNORMAL HIGH (ref 3.87–5.11)
RDW: 12.4 % (ref 11.5–15.5)
WBC: 7.9 10*3/uL (ref 4.0–10.5)
nRBC: 0 % (ref 0.0–0.2)

## 2022-02-13 LAB — URINALYSIS, ROUTINE W REFLEX MICROSCOPIC
Bilirubin Urine: NEGATIVE
Glucose, UA: >=500 mg/dL — AB
Hgb urine dipstick: NEGATIVE
Ketones, ur: NEGATIVE mg/dL
Leukocytes,Ua: NEGATIVE
Nitrite: POSITIVE — AB
Protein, ur: NEGATIVE mg/dL
Specific Gravity, Urine: >=1.03 (ref 1.005–1.030)
pH: 5.5 (ref 5.0–8.0)

## 2022-02-13 LAB — URINALYSIS, MICROSCOPIC (REFLEX)

## 2022-02-13 LAB — LIPASE, BLOOD: Lipase: 29 U/L (ref 11–51)

## 2022-02-13 MED ORDER — LIDOCAINE 5 % EX PTCH: 1.0000 | MEDICATED_PATCH | CUTANEOUS | 0 refills | Status: AC

## 2022-02-13 MED ORDER — IOHEXOL 300 MG/ML  SOLN
75.0000 mL | Freq: Once | INTRAMUSCULAR | Status: AC | PRN
  Administered 2022-02-13: 75 mL via INTRAVENOUS

## 2022-02-13 MED ORDER — OXYCODONE HCL 5 MG PO TABS: 5.0000 mg | ORAL_TABLET | Freq: Four times a day (QID) | ORAL | 0 refills | Status: AC | PRN

## 2022-02-13 MED ORDER — SULFAMETHOXAZOLE-TRIMETHOPRIM 800-160 MG PO TABS: 1.0000 | ORAL_TABLET | Freq: Two times a day (BID) | ORAL | 0 refills | Status: AC

## 2022-02-13 MED ORDER — HYDROMORPHONE HCL 1 MG/ML IJ SOLN
1.0000 mg | Freq: Once | INTRAMUSCULAR | Status: AC
  Administered 2022-02-13: 1 mg via INTRAVENOUS
  Filled 2022-02-13: qty 1

## 2022-02-13 MED ORDER — METHOCARBAMOL 500 MG PO TABS: 500.0000 mg | ORAL_TABLET | Freq: Two times a day (BID) | ORAL | 0 refills | Status: AC

## 2022-02-13 MED ORDER — SODIUM CHLORIDE 0.9 % IV BOLUS: 1000.0000 mL | Freq: Once | INTRAVENOUS | Status: DC

## 2022-02-13 MED ORDER — HYDROMORPHONE HCL 1 MG/ML IJ SOLN
0.5000 mg | Freq: Once | INTRAMUSCULAR | Status: AC
  Administered 2022-02-13: 0.5 mg via INTRAVENOUS
  Filled 2022-02-13: qty 1

## 2022-02-13 MED ORDER — IOHEXOL 350 MG/ML SOLN
75.0000 mL | Freq: Once | INTRAVENOUS | Status: AC | PRN
  Administered 2022-02-13: 75 mL via INTRAVENOUS

## 2022-02-13 NOTE — Discharge Instructions (Addendum)
Overall work-up today is unremarkable.  My suspicion is that this is a muscular related process.  I have prescribed you narcotic pain medicine, muscle relaxant and lidocaine patches for pain management.  These medications are sedating so please be careful with its use.  Do not do any dangerous activities including driving if you are taking the narcotic pain medicine, Roxicodone or muscle relaxant Robaxin.  Lidocaine patches are not sedating.  I also recommend that you take 400 mg ibuprofen every 8 hours as needed for pain.  I recommend that you take 1000 mg of Tylenol every 6 hours as needed for pain.  Urinalysis possibly with infection and have prescribed you antibiotic.   Follow-up with sports medicine doctor.

## 2022-02-13 NOTE — ED Triage Notes (Signed)
Pt c/o epigastric and RUQ pain for a couple weeks; pain in now in RT shoulder and back

## 2022-02-13 NOTE — ED Provider Notes (Addendum)
[facility] Provider Note   CSN: [id number] Arrival date & time: 02/13/22  1828     History  Chief Complaint  Patient presents with   Abdominal Pain    Natalie Brooks is a 51 y.o. female.  Patient is here with abdominal pain mostly right-sided.  History of diabetes, pyelonephritis.  History of gallbladder removal in the past.  Pain has been fairly constant for the last several weeks but worse over the last day or 2.  She had decreased appetite.  Denies any chest pain or shortness of breath or cough or sputum production.  Pain radiates to the right flank, mid back.  Denies any alcohol or drug use.  Denies any nausea vomiting diarrhea.  Denies any new physical activity.  Denies any trauma.  Nothing makes it better, movement makes it worse.  Denies any black or bloody stools.        Home Medications Prior to Admission medications   Medication Sig Start Date End Date Taking? Authorizing Provider  lidocaine (LIDODERM) 5 % Place 1 patch onto the skin daily. Remove & Discard patch within 12 hours or as directed by MD 02/13/22  Yes Gaby Harney, DO  methocarbamol (ROBAXIN) 500 MG tablet Take 1 tablet (500 mg total) by mouth 2 (two) times daily. 02/13/22  Yes Jaydian Santana, DO  oxyCODONE (ROXICODONE) 5 MG immediate release tablet Take 1 tablet (5 mg total) by mouth every 6 (six) hours as needed for up to 20 doses for breakthrough pain. 02/13/22  Yes Dareen Gutzwiller, DO  sulfamethoxazole-trimethoprim (BACTRIM DS) 800-160 MG tablet Take 1 tablet by mouth 2 (two) times daily for 5 days. 02/13/22 02/18/22 Yes Rajohn Henery, DO  acetaminophen (TYLENOL) 325 MG tablet Take 2 tablets (650 mg total) by mouth every 6 (six) hours as needed for up to 30 doses for mild pain or moderate pain. 01/10/21   Wyvonnia Dusky, MD  blood glucose meter kit and supplies KIT Dispense based on patient and insurance preference. Use up to four times daily as directed. (FOR ICD-9  250.00, 250.01). 05/26/16   Donne Hazel, MD  cyclobenzaprine (FLEXERIL) 10 MG tablet Take 1 tablet (10 mg total) by mouth 2 (two) times daily as needed for muscle spasms. 03/15/18   Tacy Learn, PA-C  ibuprofen (ADVIL) 800 MG tablet Take 1 tablet (800 mg total) by mouth every 8 (eight) hours as needed for up to 30 doses. 01/10/21   Wyvonnia Dusky, MD  insulin aspart (NOVOLOG) 100 UNIT/ML FlexPen Inject 4 Units into the skin 3 (three) times daily with meals. 05/26/16   Donne Hazel, MD  Insulin Glargine (LANTUS) 100 UNIT/ML Solostar Pen Inject 24 Units into the skin daily at 10 pm. 05/26/16   Donne Hazel, MD  Insulin Pen Needle 32G X 5 MM MISC 1 Device by Does not apply route 4 (four) times daily. 05/26/16   Donne Hazel, MD      Allergies    Fentanyl, Penicillins, and Trimox [amoxicillin]    Review of Systems   Review of Systems  Physical Exam Updated Vital Signs BP (!) 120/55   Pulse 74   Temp 98.2 F (36.8 C) (Oral)   Resp (!) 21   Ht _0  (1.803 m)   Wt 95.3 kg   LMP 06/01/2016   SpO2 95%   BMI 29.29 kg/m  Physical Exam Vitals and nursing note reviewed.  Constitutional:      General: She is not in  acute distress.    Appearance: She is well-developed. She is not ill-appearing.  HENT:     Head: Normocephalic and atraumatic.     Mouth/Throat:     Mouth: Mucous membranes are moist.  Eyes:     Extraocular Movements: Extraocular movements intact.     Conjunctiva/sclera: Conjunctivae normal.  Cardiovascular:     Rate and Rhythm: Normal rate and regular rhythm.     Heart sounds: Normal heart sounds. No murmur heard. Pulmonary:     Effort: Pulmonary effort is normal. No respiratory distress.     Breath sounds: Normal breath sounds.  Abdominal:     General: Abdomen is flat.     Palpations: Abdomen is soft.     Tenderness: There is abdominal tenderness in the right upper quadrant and epigastric area. There is right CVA tenderness.     Hernia: No hernia is  present.  Musculoskeletal:        General: No swelling.     Cervical back: Neck supple.  Skin:    General: Skin is warm and dry.     Capillary Refill: Capillary refill takes less than 2 seconds.  Neurological:     Mental Status: She is alert.  Psychiatric:        Mood and Affect: Mood normal.     ED Results / Procedures / Treatments   Labs (all labs ordered are listed, but only abnormal results are displayed) Labs Reviewed  CBC WITH DIFFERENTIAL/PLATELET - Abnormal; Notable for the following components:      Result Value   RBC 5.13 (*)    Hemoglobin 15.3 (*)    All other components within normal limits  COMPREHENSIVE METABOLIC PANEL - Abnormal; Notable for the following components:   Glucose, Bld 313 (*)    All other components within normal limits  URINALYSIS, ROUTINE W REFLEX MICROSCOPIC - Abnormal; Notable for the following components:   APPearance HAZY (*)    Glucose, UA >=500 (*)    Nitrite POSITIVE (*)    All other components within normal limits  URINALYSIS, MICROSCOPIC (REFLEX) - Abnormal; Notable for the following components:   Bacteria, UA MANY (*)    All other components within normal limits  URINE CULTURE  LIPASE, BLOOD    EKG EKG Interpretation  Date/Time:  Saturday February 13 2022 19:16:24 EST Ventricular Rate:  103 PR Interval:  134 QRS Duration: 89 QT Interval:  337 QTC Calculation: 442 R Axis:   -9 Text Interpretation: Sinus tachycardia Probable left atrial enlargement Low voltage, precordial leads Confirmed by Ronnald Nian, Kimball Manske ([id number]) on 02/13/2022 7:51:32 PM  Radiology CT Angio Chest PE W and/or Wo Contrast  Result Date: 02/13/2022 CLINICAL DATA:  Right-sided chest pain. High probability for pulmonary embolism. EXAM: CT ANGIOGRAPHY CHEST WITH CONTRAST TECHNIQUE: Multidetector CT imaging of the chest was performed using the standard protocol during bolus administration of intravenous contrast. Multiplanar CT image reconstructions and MIPs were  obtained to evaluate the vascular anatomy. RADIATION DOSE REDUCTION: This exam was performed according to the departmental dose-optimization program which includes automated exposure control, adjustment of the mA and/or kV according to patient size and/or use of iterative reconstruction technique. CONTRAST:  55m OMNIPAQUE IOHEXOL 350 MG/ML SOLN COMPARISON:  None Available. FINDINGS: Cardiovascular: Satisfactory opacification of pulmonary arteries noted, and no pulmonary emboli identified. No evidence of thoracic aortic dissection or aneurysm. Mediastinum/Nodes: No masses or pathologically enlarged lymph nodes identified. Lungs/Pleura: No pulmonary mass, infiltrate, or effusion. Upper abdomen: No acute findings. Musculoskeletal: No suspicious bone lesions  identified. Review of the MIP images confirms the above findings. IMPRESSION: Negative. No evidence of pulmonary embolism or other active disease. Electronically Signed   By: Marlaine Hind M.D.   On: 02/13/2022 20:56   DG Chest Portable 1 View  Result Date: 02/13/2022 CLINICAL DATA:  Right chest and shoulder pain. EXAM: PORTABLE CHEST 1 VIEW COMPARISON:  05/24/2016 FINDINGS: The heart size and mediastinal contours are within normal limits. Both lungs are clear. The visualized skeletal structures are unremarkable. IMPRESSION: No active disease. Electronically Signed   By: Marlaine Hind M.D.   On: 02/13/2022 20:18   CT ABDOMEN PELVIS W CONTRAST  Result Date: 02/13/2022 CLINICAL DATA:  Epigastric and right upper quadrant pain EXAM: CT ABDOMEN AND PELVIS WITH CONTRAST TECHNIQUE: Multidetector CT imaging of the abdomen and pelvis was performed using the standard protocol following bolus administration of intravenous contrast. RADIATION DOSE REDUCTION: This exam was performed according to the departmental dose-optimization program which includes automated exposure control, adjustment of the mA and/or kV according to patient size and/or use of iterative  reconstruction technique. CONTRAST:  42m OMNIPAQUE IOHEXOL 300 MG/ML  SOLN COMPARISON:  05/18/2016 FINDINGS: Lower chest: No acute abnormality Hepatobiliary: Prior cholecystectomy. Diffuse fatty infiltration of the liver. No focal hepatic abnormality. Pancreas: No focal abnormality or ductal dilatation. Spleen: No focal abnormality.  Normal size. Adrenals/Urinary Tract: No adrenal abnormality. No focal renal abnormality. No stones or hydronephrosis. Urinary bladder is unremarkable. Stomach/Bowel: Normal appendix. Stomach, large and small bowel grossly unremarkable. Vascular/Lymphatic: Scattered aortic calcifications. No evidence of aneurysm or adenopathy. Reproductive: Uterus and adnexa unremarkable.  No mass. Other: No free fluid or free air. Musculoskeletal: No acute bony abnormality. IMPRESSION: Hepatic steatosis, improved since prior study. No acute findings in the abdomen or pelvis. Electronically Signed   By: KRolm BaptiseM.D.   On: 02/13/2022 20:17    Procedures Procedures    Medications Ordered in ED Medications  sodium chloride 0.9 % bolus 1,000 mL ( Intravenous Restarted 02/13/22 2039)  HYDROmorphone (DILAUDID) injection 0.5 mg (0.5 mg Intravenous Given 02/13/22 1915)  iohexol (OMNIPAQUE) 300 MG/ML solution 75 mL (75 mLs Intravenous Contrast Given 02/13/22 2009)  HYDROmorphone (DILAUDID) injection 1 mg (1 mg Intravenous Given 02/13/22 2037)  iohexol (OMNIPAQUE) 350 MG/ML injection 75 mL (75 mLs Intravenous Contrast Given 02/13/22 2050)    ED Course/ Medical Decision Making/ A&P                           Medical Decision Making Amount and/or Complexity of Data Reviewed Labs: ordered. Radiology: ordered.  Risk Prescription drug management.   CDaveda Larockis here with right flank pain.  Unremarkable vitals.  No fever.  Pain for the last several weeks but worse over the last day or 2.  Differential diagnosis includes pyelonephritis versus kidney stone versus common bile duct  issue versus pancreatitis versus less likely pneumonia.  She has no cardiac risk factors but will get EKG.  She not having any chest pain and overall doubt ACS.  She has no PE risk factors and doubt PE.  Wells criteria 0.  She is already had her gallbladder removed in the past.  She denies any black or bloody stools and seems less likely to be an ulcer or GI bleed.  Could be gastritis.  She is fairly tender over the right flank and right posterior ribs and this could be a muscular process as well.  We will get CBC, CMP, lipase, EKG, CT  scan abdomen pelvis, chest x-ray.  Will give patient IV Dilaudid and IV fluids and reevaluate.  Per my review and interpretation of labs, possible UTI however not quite sure given urinalysis.  However will treat with antibiotics.  Urine culture sent.  For further review of labs no significant anemia, electrolyte abnormality or kidney injury.  CT scan of the chest showed no PE or pneumonia.  CT scan of the abdomen and pelvis showed no acute findings.  No pancreatitis.  No choledocholithiasis.  Ultimately I think this is a musculoskeletal process.  We will treat her with Robaxin, Roxicodone and lidocaine patches and have her follow-up with primary care/sports medicine.  Discharged in good condition.  Understands return precautions.  This chart was dictated using voice recognition software.  Despite best efforts to proofread,  errors can occur which can change the documentation meaning.         Final Clinical Impression(s) / ED Diagnoses Final diagnoses:  Flank pain    Rx / DC Orders ED Discharge Orders          Ordered    sulfamethoxazole-trimethoprim (BACTRIM DS) 800-160 MG tablet  2 times daily        02/13/22 2059    methocarbamol (ROBAXIN) 500 MG tablet  2 times daily        02/13/22 2059    oxyCODONE (ROXICODONE) 5 MG immediate release tablet  Every 6 hours PRN        02/13/22 2059    lidocaine (LIDODERM) 5 %  Every 24 hours        02/13/22 2059               Lennice Sites, DO 02/13/22 2100    Lennice Sites, DO 02/13/22 2242

## 2022-02-13 NOTE — ED Notes (Signed)
Pt complains of sharp pain to RUQ abd, radiates over to epigastric region, and then around to L midax. No rebound tenderness, sharp tenderness and guarding upon assessment. Hypoactive bowel. No diarrhea, n/v. Endorses loss of appetite for 2x weeks, just starting eating again on 13th.   Been taking her LA insulin as prescribed.

## 2022-02-13 NOTE — ED Notes (Signed)
Patient transported to CT 

## 2022-02-14 LAB — URINE CULTURE

## 2022-02-22 ENCOUNTER — Ambulatory Visit: Payer: Medicaid Other | Admitting: Family Medicine

## 2022-07-12 ENCOUNTER — Encounter: Payer: Self-pay | Admitting: *Deleted

## 2022-09-17 ENCOUNTER — Other Ambulatory Visit: Payer: Self-pay

## 2022-09-17 ENCOUNTER — Emergency Department (HOSPITAL_BASED_OUTPATIENT_CLINIC_OR_DEPARTMENT_OTHER)
Admission: EM | Admit: 2022-09-17 | Discharge: 2022-09-18 | Disposition: A | Discharge status: Home / Self Care | Payer: Medicaid Other | Attending: Emergency Medicine | Admitting: Emergency Medicine

## 2022-09-17 ENCOUNTER — Encounter (HOSPITAL_BASED_OUTPATIENT_CLINIC_OR_DEPARTMENT_OTHER): Payer: Self-pay | Admitting: Emergency Medicine

## 2022-09-17 DIAGNOSIS — Z20822 Contact with and (suspected) exposure to covid-19: Secondary | ICD-10-CM | POA: Insufficient documentation

## 2022-09-17 DIAGNOSIS — J020 Streptococcal pharyngitis: Secondary | ICD-10-CM

## 2022-09-17 DIAGNOSIS — Z794 Long term (current) use of insulin: Secondary | ICD-10-CM | POA: Insufficient documentation

## 2022-09-17 DIAGNOSIS — J029 Acute pharyngitis, unspecified: Secondary | ICD-10-CM | POA: Diagnosis present

## 2022-09-17 LAB — RESP PANEL BY RT-PCR (RSV, FLU A&B, COVID)  RVPGX2
Influenza A by PCR: NEGATIVE
Influenza B by PCR: NEGATIVE
Resp Syncytial Virus by PCR: NEGATIVE
SARS Coronavirus 2 by RT PCR: NEGATIVE

## 2022-09-17 LAB — GROUP A STREP BY PCR: Group A Strep by PCR: DETECTED — AB

## 2022-09-17 NOTE — ED Triage Notes (Signed)
Reports sore throat since "the beginning of May, states that both ears have begun to hurt, went to UC on 6/10 and had a z-pack, cxr showed bronchitis; denies n/v/d, fever, ShoB

## 2022-09-18 MED ORDER — CEFDINIR 300 MG PO CAPS: 300.0000 mg | ORAL_CAPSULE | Freq: Two times a day (BID) | ORAL | 0 refills | Status: AC

## 2022-09-18 NOTE — ED Provider Notes (Signed)
[facility] Provider Note   CSN: [id number] Arrival date & time: 09/17/22  2107     History  Chief Complaint  Patient presents with   Sore Throat    Natalie Brooks is a 52 y.o. female.  Patient complains of a sore throat.  Patient reports that she has been feeling sick since April.  Patient reports that she took Zithromax in May for bronchitis.  Patient had a chest x-ray which showed bronchitis done at urgent care.  Patient is a smoker.  Patient reports she may have been exposed to older and with illness as she had had grandchildren who had ear infections  The history is provided by the patient. No language interpreter was used.  Sore Throat This is a new problem.       Home Medications Prior to Admission medications   Medication Sig Start Date End Date Taking? Authorizing Provider  cefdinir (OMNICEF) 300 MG capsule Take 1 capsule (300 mg total) by mouth 2 (two) times daily. 09/18/22  Yes Elson Areas, PA-C  acetaminophen (TYLENOL) 325 MG tablet Take 2 tablets (650 mg total) by mouth every 6 (six) hours as needed for up to 30 doses for mild pain or moderate pain. 01/10/21   Terald Sleeper, MD  blood glucose meter kit and supplies KIT Dispense based on patient and insurance preference. Use up to four times daily as directed. (FOR ICD-9 250.00, 250.01). 05/26/16   Jerald Kief, MD  cyclobenzaprine (FLEXERIL) 10 MG tablet Take 1 tablet (10 mg total) by mouth 2 (two) times daily as needed for muscle spasms. 03/15/18   Jeannie Fend, PA-C  ibuprofen (ADVIL) 800 MG tablet Take 1 tablet (800 mg total) by mouth every 8 (eight) hours as needed for up to 30 doses. 01/10/21   Terald Sleeper, MD  insulin aspart (NOVOLOG) 100 UNIT/ML FlexPen Inject 4 Units into the skin 3 (three) times daily with meals. 05/26/16   Jerald Kief, MD  Insulin Glargine (LANTUS) 100 UNIT/ML Solostar Pen Inject 24 Units into the skin daily at 10 pm.  05/26/16   Jerald Kief, MD  Insulin Pen Needle 32G X 5 MM MISC 1 Device by Does not apply route 4 (four) times daily. 05/26/16   Jerald Kief, MD  lidocaine (LIDODERM) 5 % Place 1 patch onto the skin daily. Remove & Discard patch within 12 hours or as directed by MD 02/13/22   Virgina Norfolk, DO  methocarbamol (ROBAXIN) 500 MG tablet Take 1 tablet (500 mg total) by mouth 2 (two) times daily. 02/13/22   Curatolo, Adam, DO  oxyCODONE (ROXICODONE) 5 MG immediate release tablet Take 1 tablet (5 mg total) by mouth every 6 (six) hours as needed for up to 20 doses for breakthrough pain. 02/13/22   Curatolo, Adam, DO      Allergies    Fentanyl, Penicillins, and Trimox [amoxicillin]    Review of Systems   Review of Systems  All other systems reviewed and are negative.   Physical Exam Updated Vital Signs BP 103/73 (BP Location: Left Arm)   Pulse (!) 105   Temp 98.6 F (37 C) (Oral)   Resp 16   Ht 6' (1.829 m)   Wt 95.3 kg   LMP 06/01/2016   SpO2 98%   BMI 28.48 kg/m  Physical Exam Vitals and nursing note reviewed.  Constitutional:      General: She is not in acute distress.  Appearance: She is well-developed.  HENT:     Head: Normocephalic and atraumatic.     Mouth/Throat:     Pharynx: Posterior oropharyngeal erythema present.  Eyes:     Conjunctiva/sclera: Conjunctivae normal.  Cardiovascular:     Rate and Rhythm: Normal rate and regular rhythm.     Heart sounds: No murmur heard. Pulmonary:     Effort: Pulmonary effort is normal. No respiratory distress.     Breath sounds: Normal breath sounds.  Abdominal:     Palpations: Abdomen is soft.     Tenderness: There is no abdominal tenderness.  Musculoskeletal:        General: No swelling.     Cervical back: Neck supple.  Skin:    General: Skin is warm and dry.     Capillary Refill: Capillary refill takes less than 2 seconds.  Neurological:     Mental Status: She is alert.  Psychiatric:        Mood and Affect: Mood  normal.     ED Results / Procedures / Treatments   Labs (all labs ordered are listed, but only abnormal results are displayed) Labs Reviewed  GROUP A STREP BY PCR - Abnormal; Notable for the following components:      Result Value   Group A Strep by PCR DETECTED (*)    All other components within normal limits  RESP PANEL BY RT-PCR (RSV, FLU A&B, COVID)  [id number]    EKG None  Radiology No results found.  Procedures Procedures    Medications Ordered in ED Medications - No data to display  ED Course/ Medical Decision Making/ A&P                             Medical Decision Making Patient complains of a fever and a sore throat.  Amount and/or Complexity of Data Reviewed External Data Reviewed: radiology.    Details: Chest x-ray from urgent care reviewed patient diagnosed with bronchitis Labs: ordered. Decision-making details documented in ED Course.    Details: Labs ordered reviewed and interpreted strep screen is positive  Risk Prescription drug management. Risk Details: Patient is allergic to fentanyl, penicillin and amoxicillin.  Patient given a prescription for Omnicef.  She is advised to follow-up with her primary care physician for recheck in 3 to 4 days.           Final Clinical Impression(s) / ED Diagnoses Final diagnoses:  Strep pharyngitis    Rx / DC Orders ED Discharge Orders          Ordered    cefdinir (OMNICEF) 300 MG capsule  2 times daily        09/18/22 0016           An After Visit Summary was printed and given to the patient.    Osie Cheeks 09/18/22 0020    Molpus, Jonny Ruiz, MD 09/18/22 [phone]

## 2022-09-18 NOTE — ED Notes (Signed)
Pt A&OX4 ambulatory at d/c with independent steady gait. Pt verbalized understanding of d/c instructions, prescription and follow up care. 

## 2022-09-18 NOTE — Discharge Instructions (Signed)
See your Physician for recheck in 3-4 days °

## 2023-05-10 IMAGING — CR DG SHOULDER 2+V*R*
2 series · 2 of 2 positions shown · non-contrast
Comparison: None.

CLINICAL DATA: Fall, limited range of motion.

EXAM:
RIGHT SHOULDER - 2+ VIEW

[x shoulder ap right]
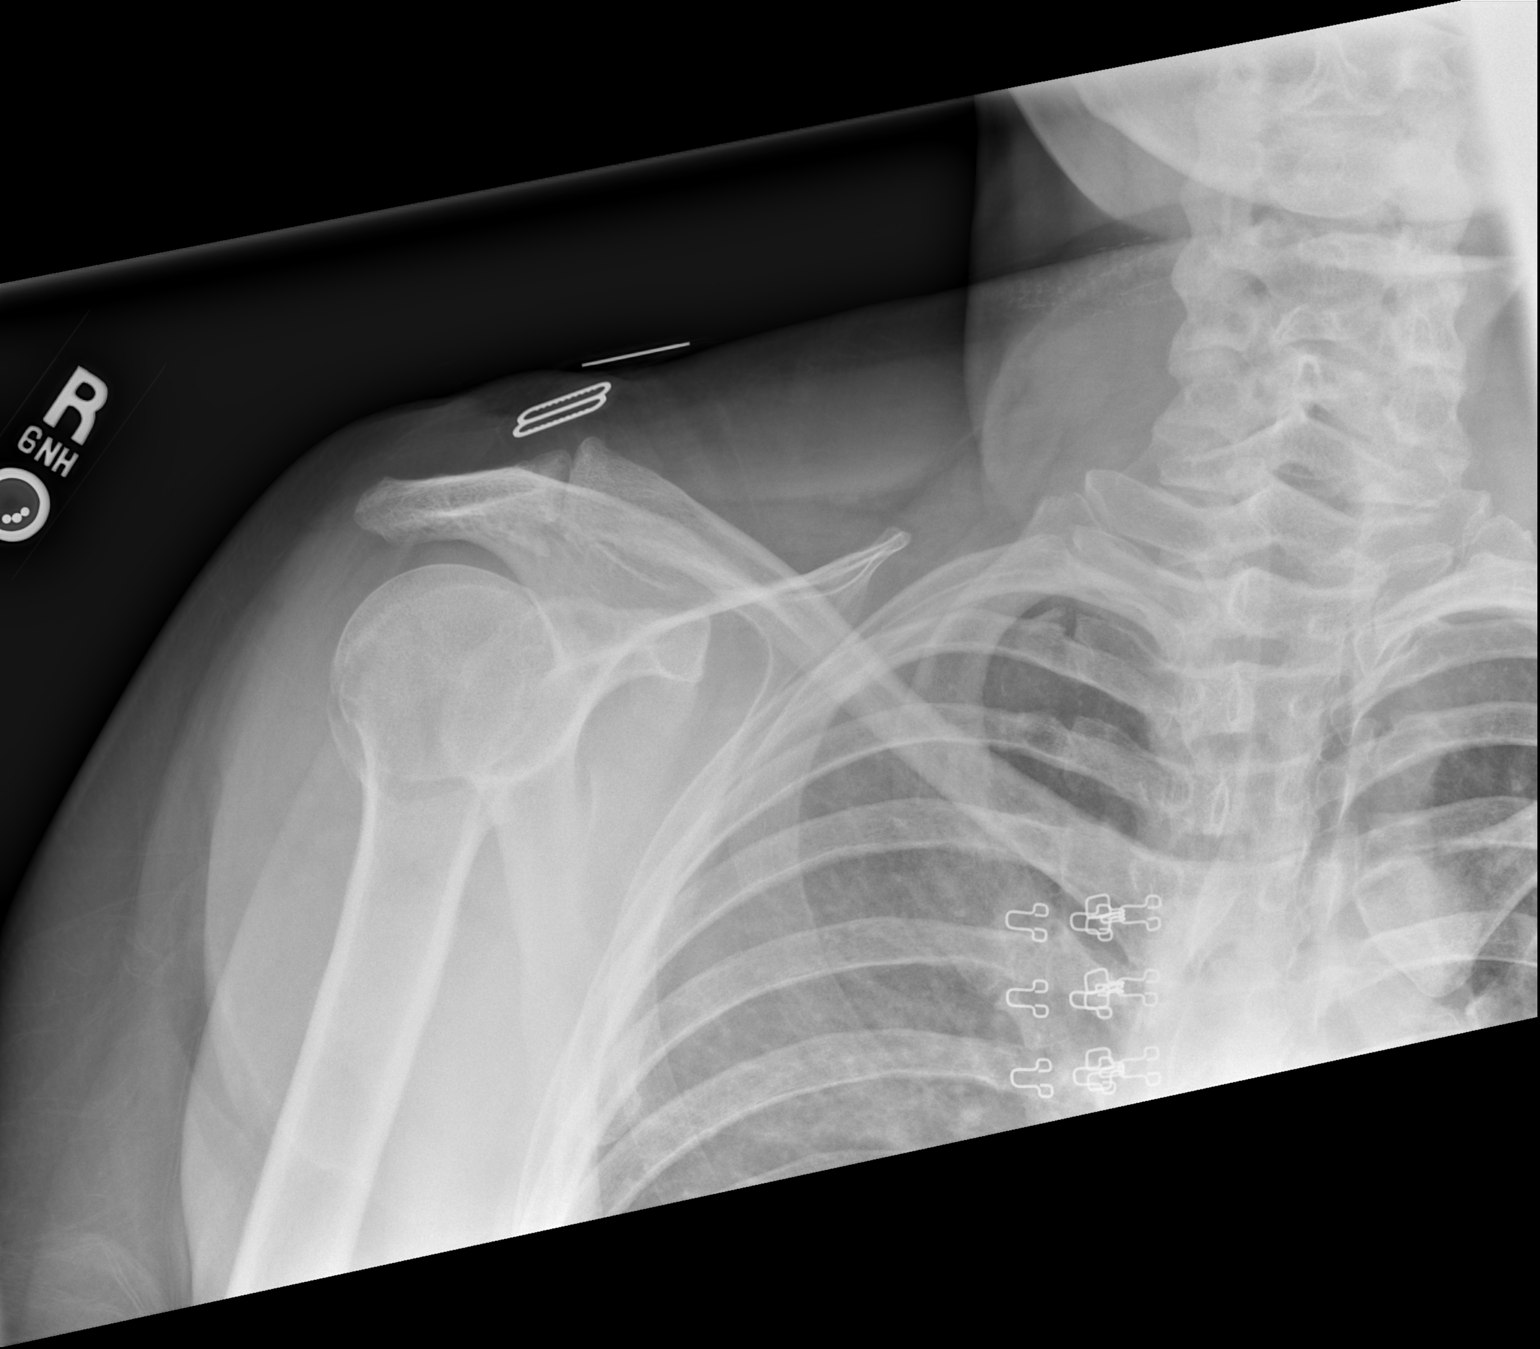

[x shoulder axillary right]
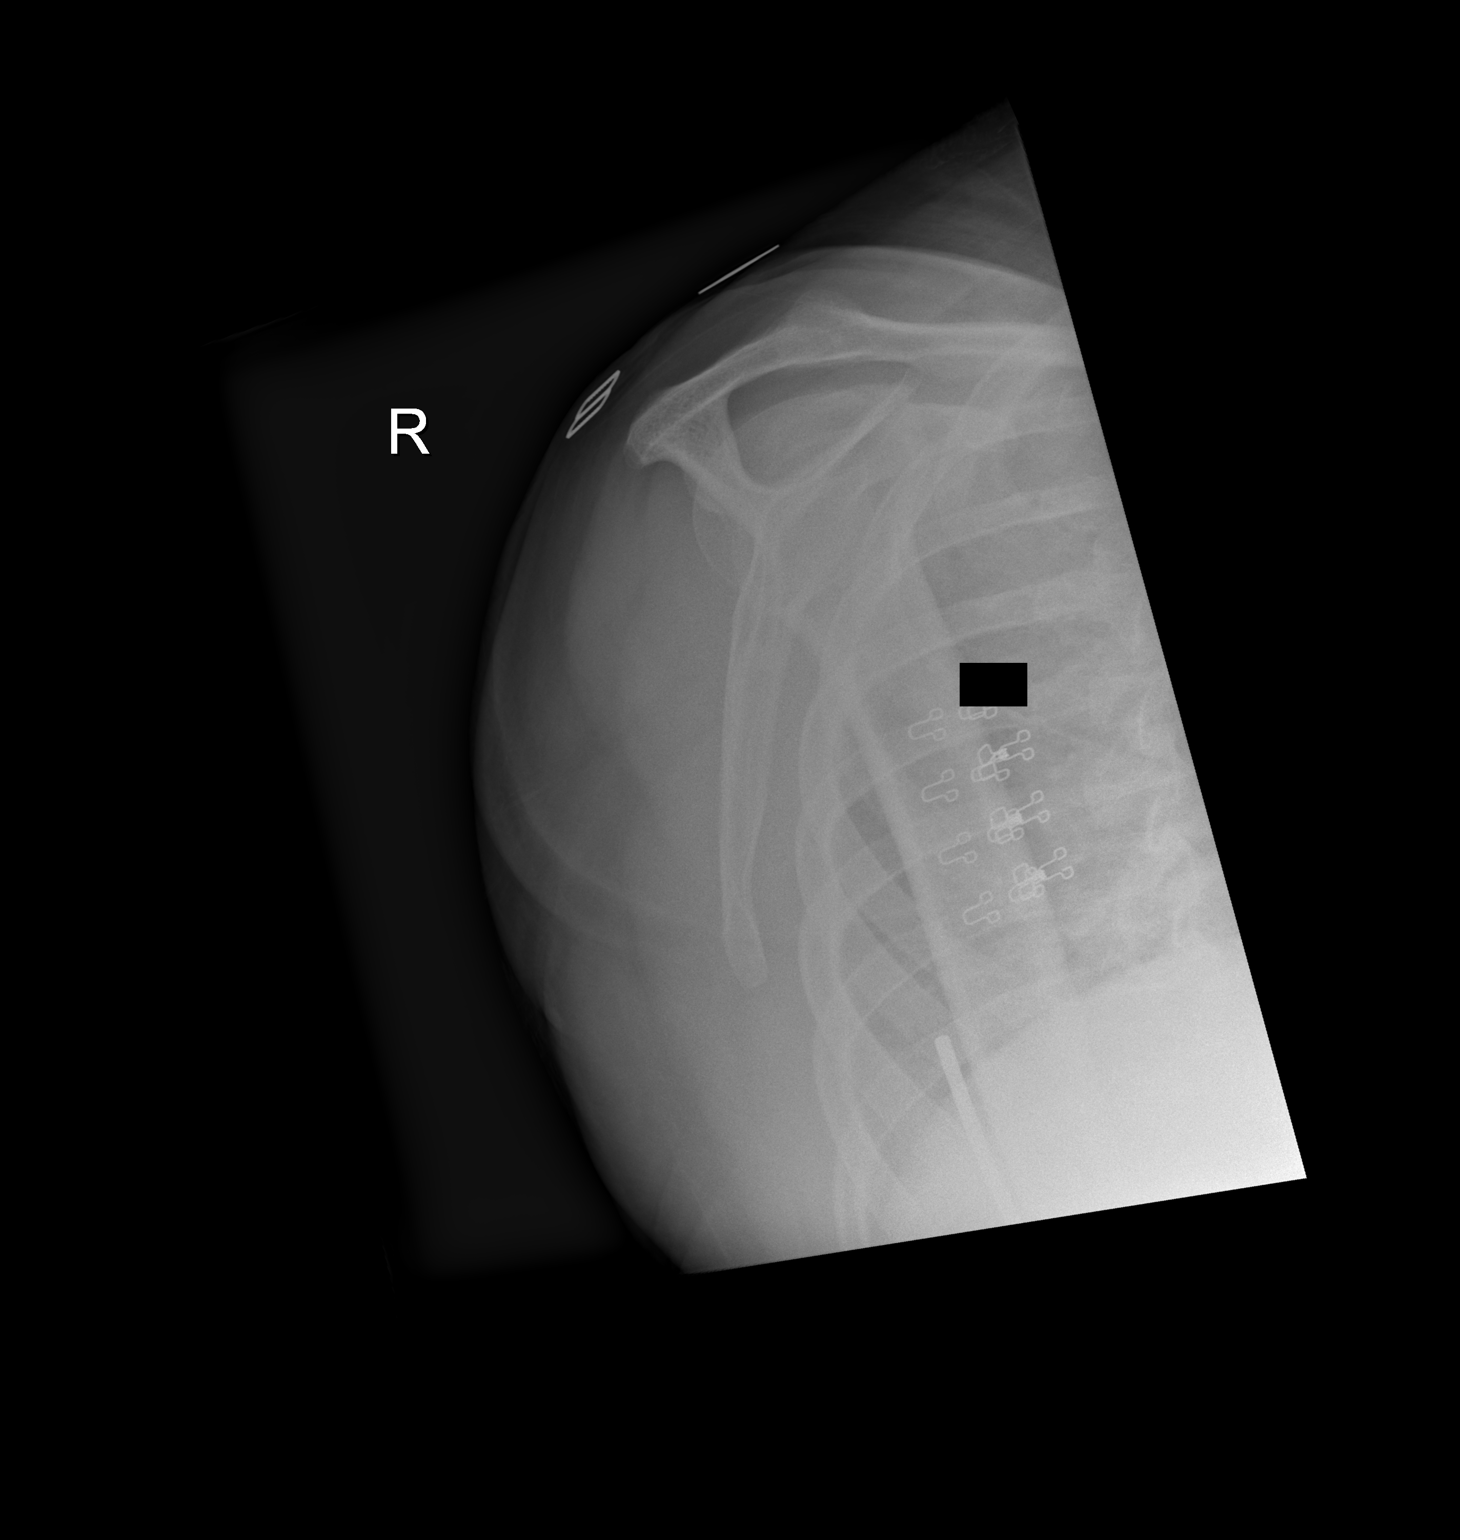

[2 of 2 positions shown; findings below may reference images not displayed]

FINDINGS: Slightly displaced fracture at the RIGHT humeral neck, difficult to
definitively characterize due to superimposition of the proximal
RIGHT humerus and RIGHT scapula.

Humeral head is grossly well positioned relative to the glenoid
fossa. Acromioclavicular joint space appears normally aligned. Mild
degenerative change at the acromioclavicular joint space.
IMPRESSION: Slightly displaced fracture of the RIGHT humeral neck, incompletely
visualized due to superimposition of osseous structures.

## 2023-05-10 IMAGING — CR DG HAND 2V*R*
2 series · 2 of 2 positions shown · non-contrast
Comparison: None.

CLINICAL DATA: Fall, pain

EXAM:
RIGHT HAND - 2 VIEW

[x hand pa right]
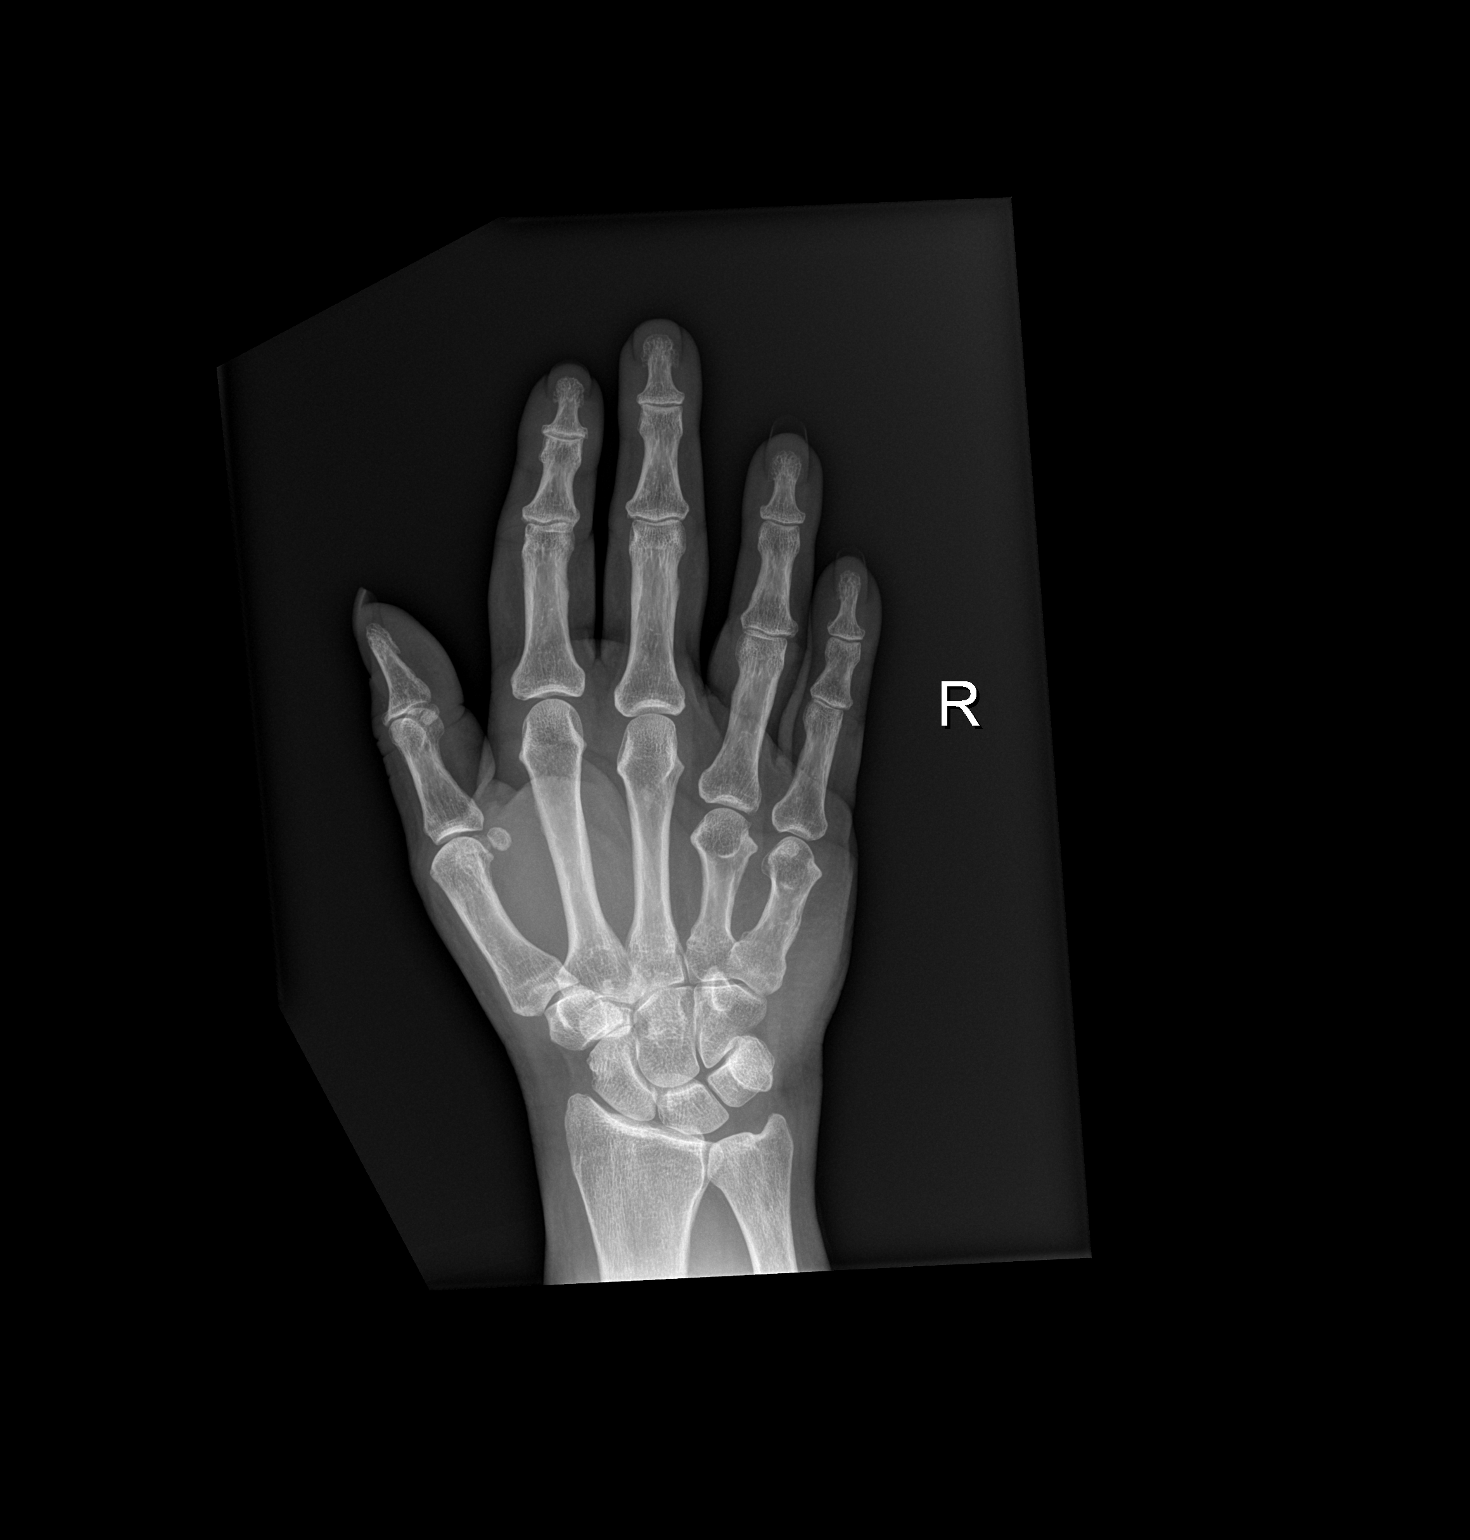

[x hand lat right]
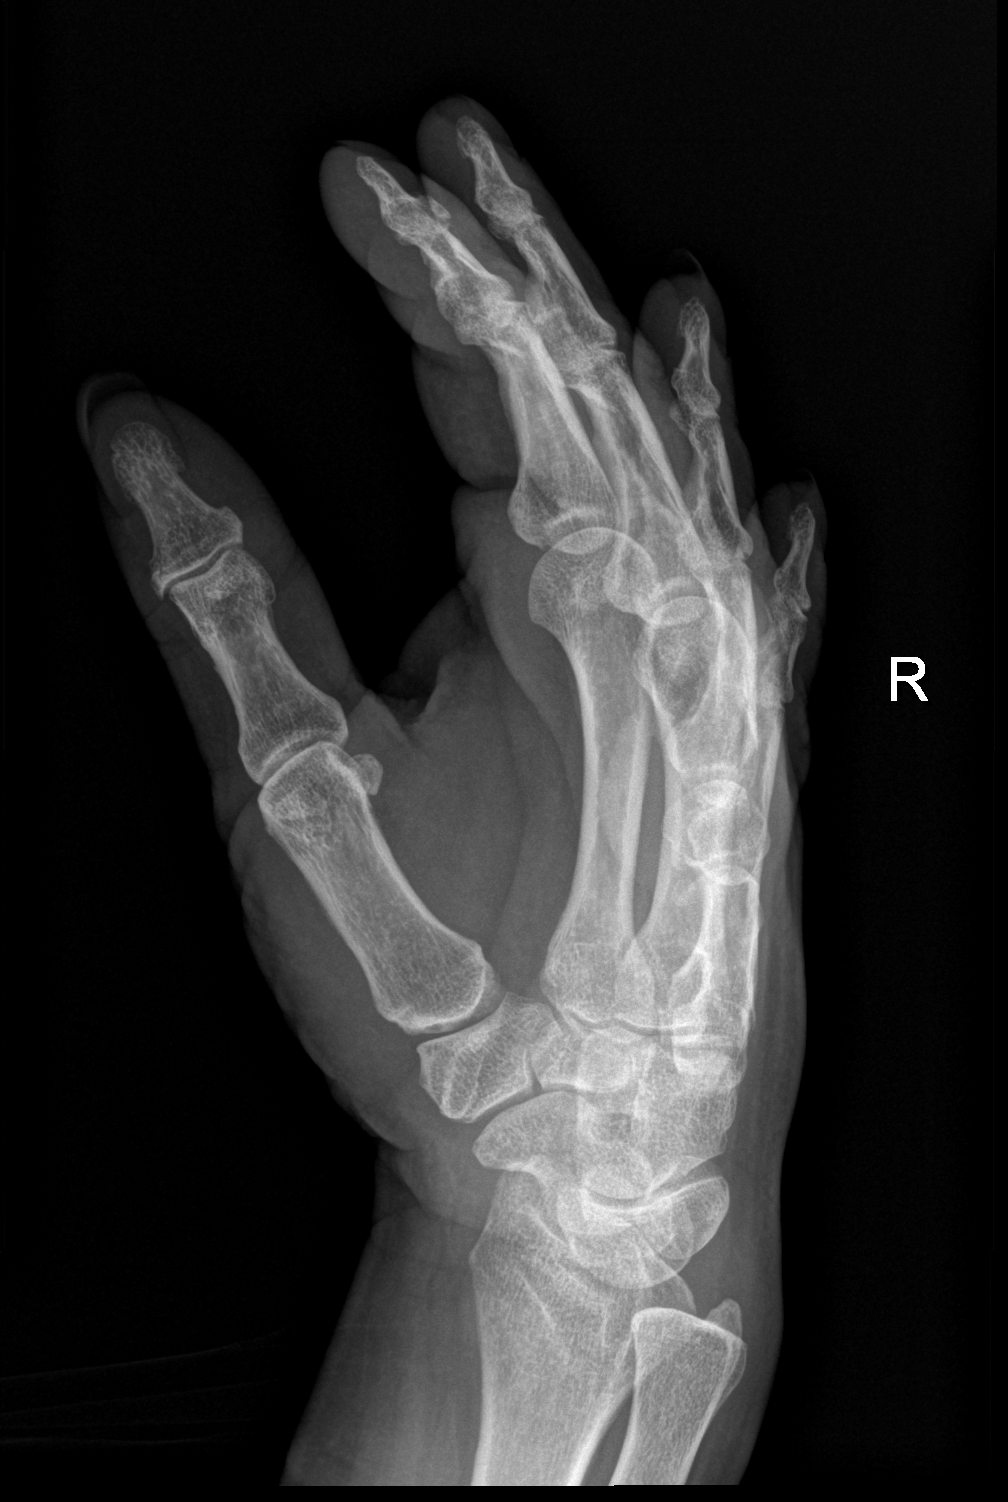

[2 of 2 positions shown; findings below may reference images not displayed]

FINDINGS: There is no evidence of acute fracture or dislocation. Diminutive
fourth and fifth metacarpal bones, congenital versus sequela old
fractures. There is no evidence of significant arthropathy. Soft
tissues are unremarkable.
IMPRESSION: No acute findings. No acute fracture or dislocation.

## 2023-05-10 IMAGING — CR DG ELBOW 2V*R*
2 series · 2 of 2 positions shown · non-contrast
Comparison: None.

CLINICAL DATA: Fall, pain.

EXAM:
RIGHT ELBOW - 2 VIEW

[x elbow lat right]
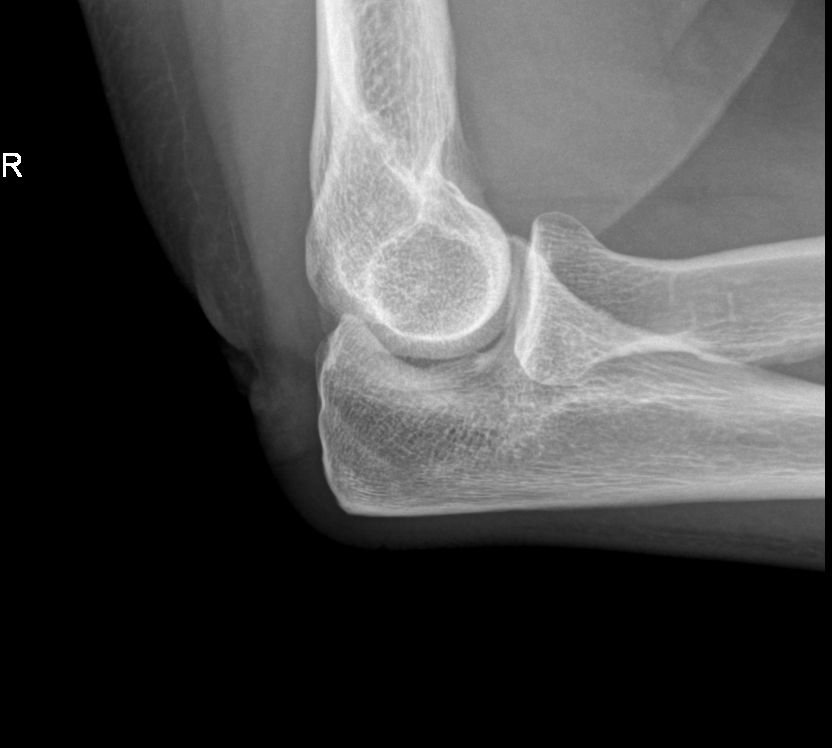

[x elbow ap right]
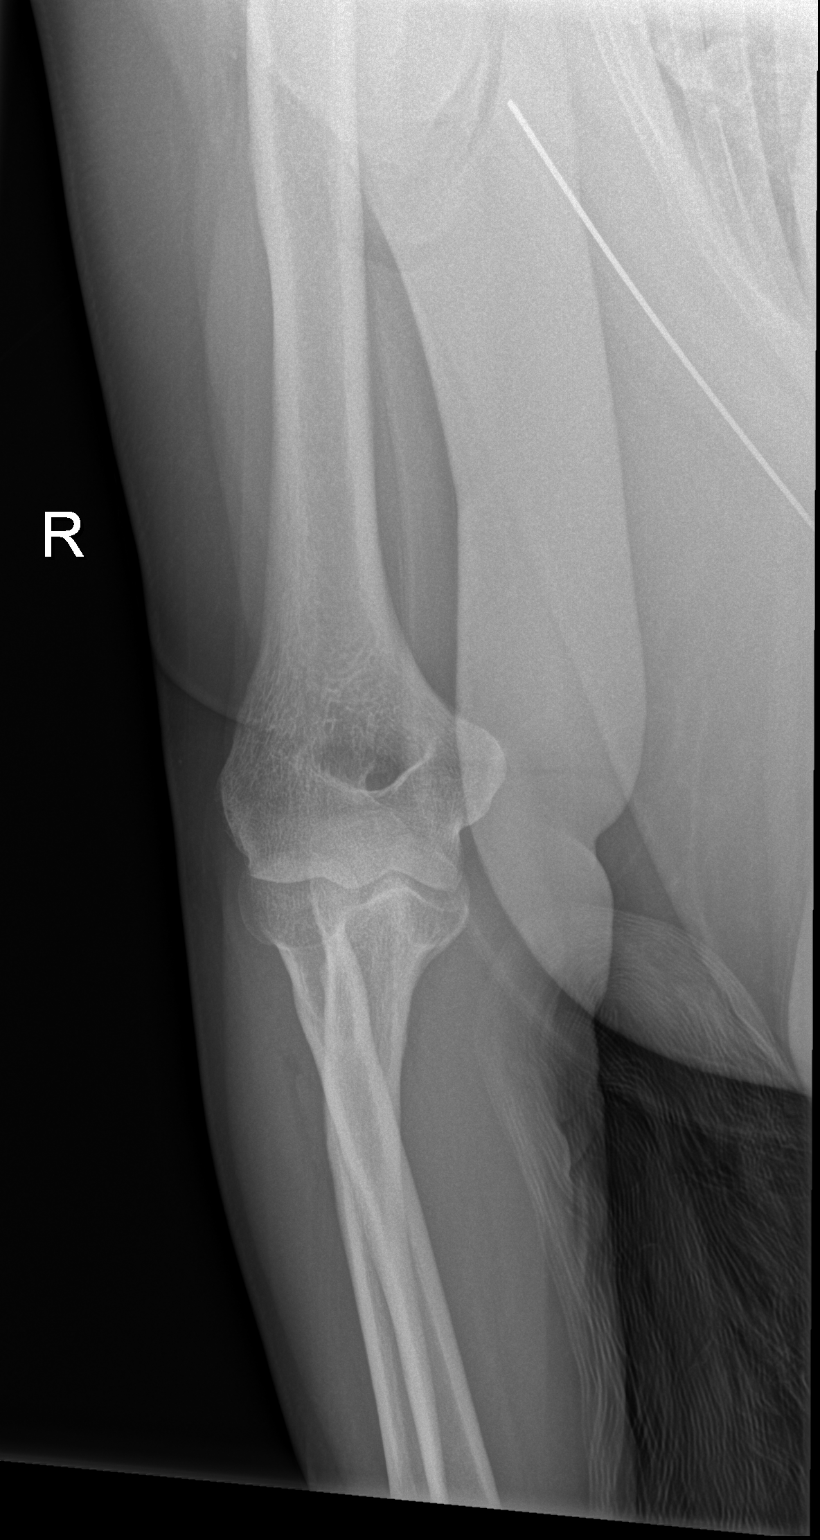

[2 of 2 positions shown; findings below may reference images not displayed]

FINDINGS: Osseous structures about the RIGHT elbow appear intact and normally
aligned. No evidence of joint effusion.
IMPRESSION: Negative.

## 2023-05-10 IMAGING — CR DG FOREARM 2V*R*
3 series · 3 of 3 positions shown · non-contrast
Comparison: None.

CLINICAL DATA: Fall, pain.

EXAM:
RIGHT FOREARM - 2 VIEW

[x forearm lat right]
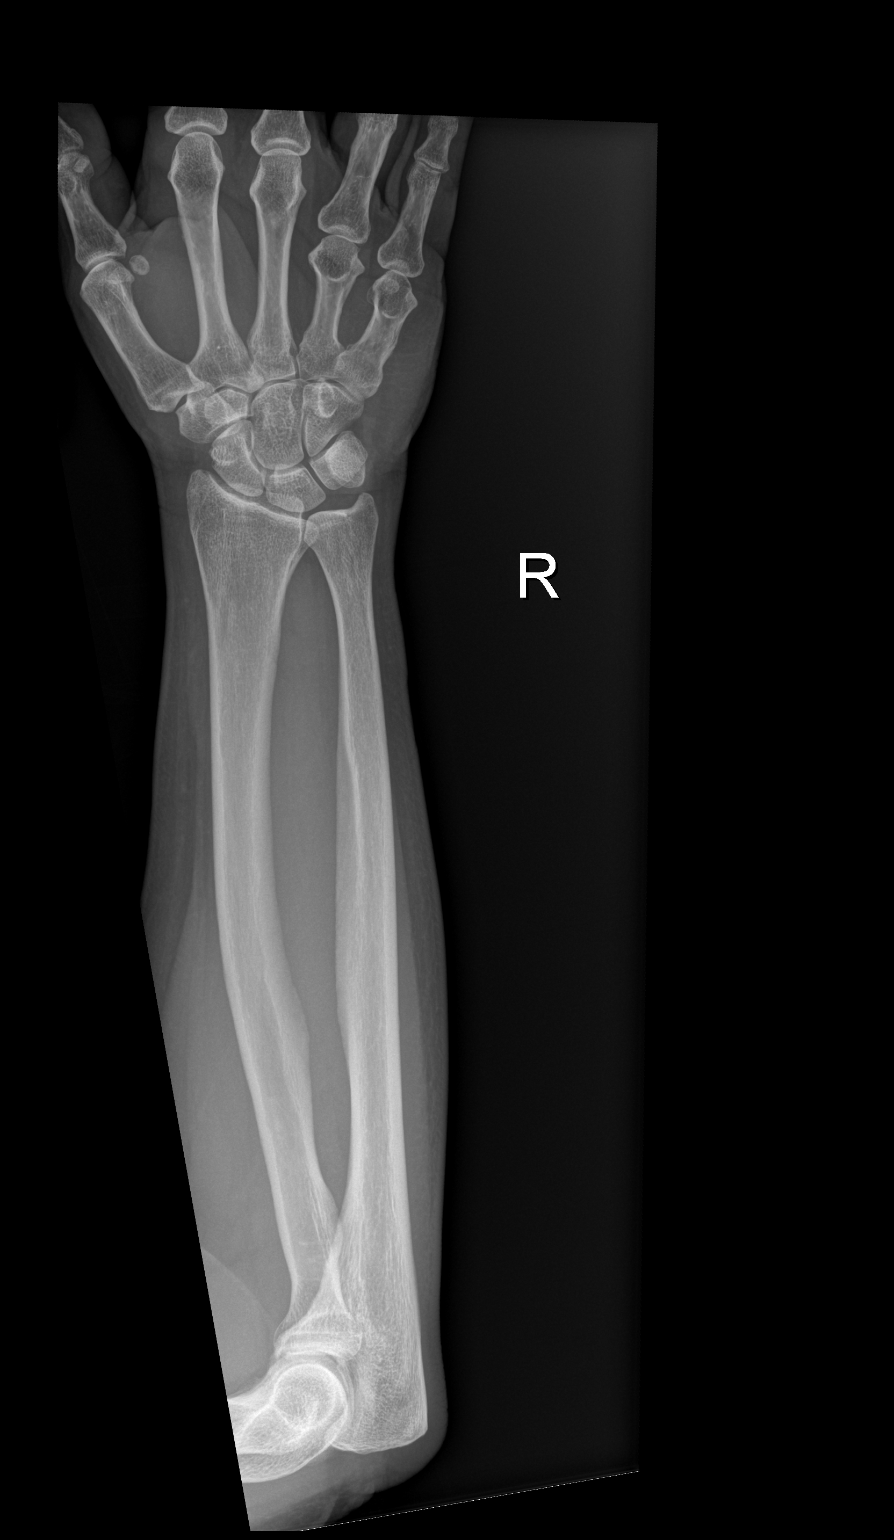

[x forearm ap right]
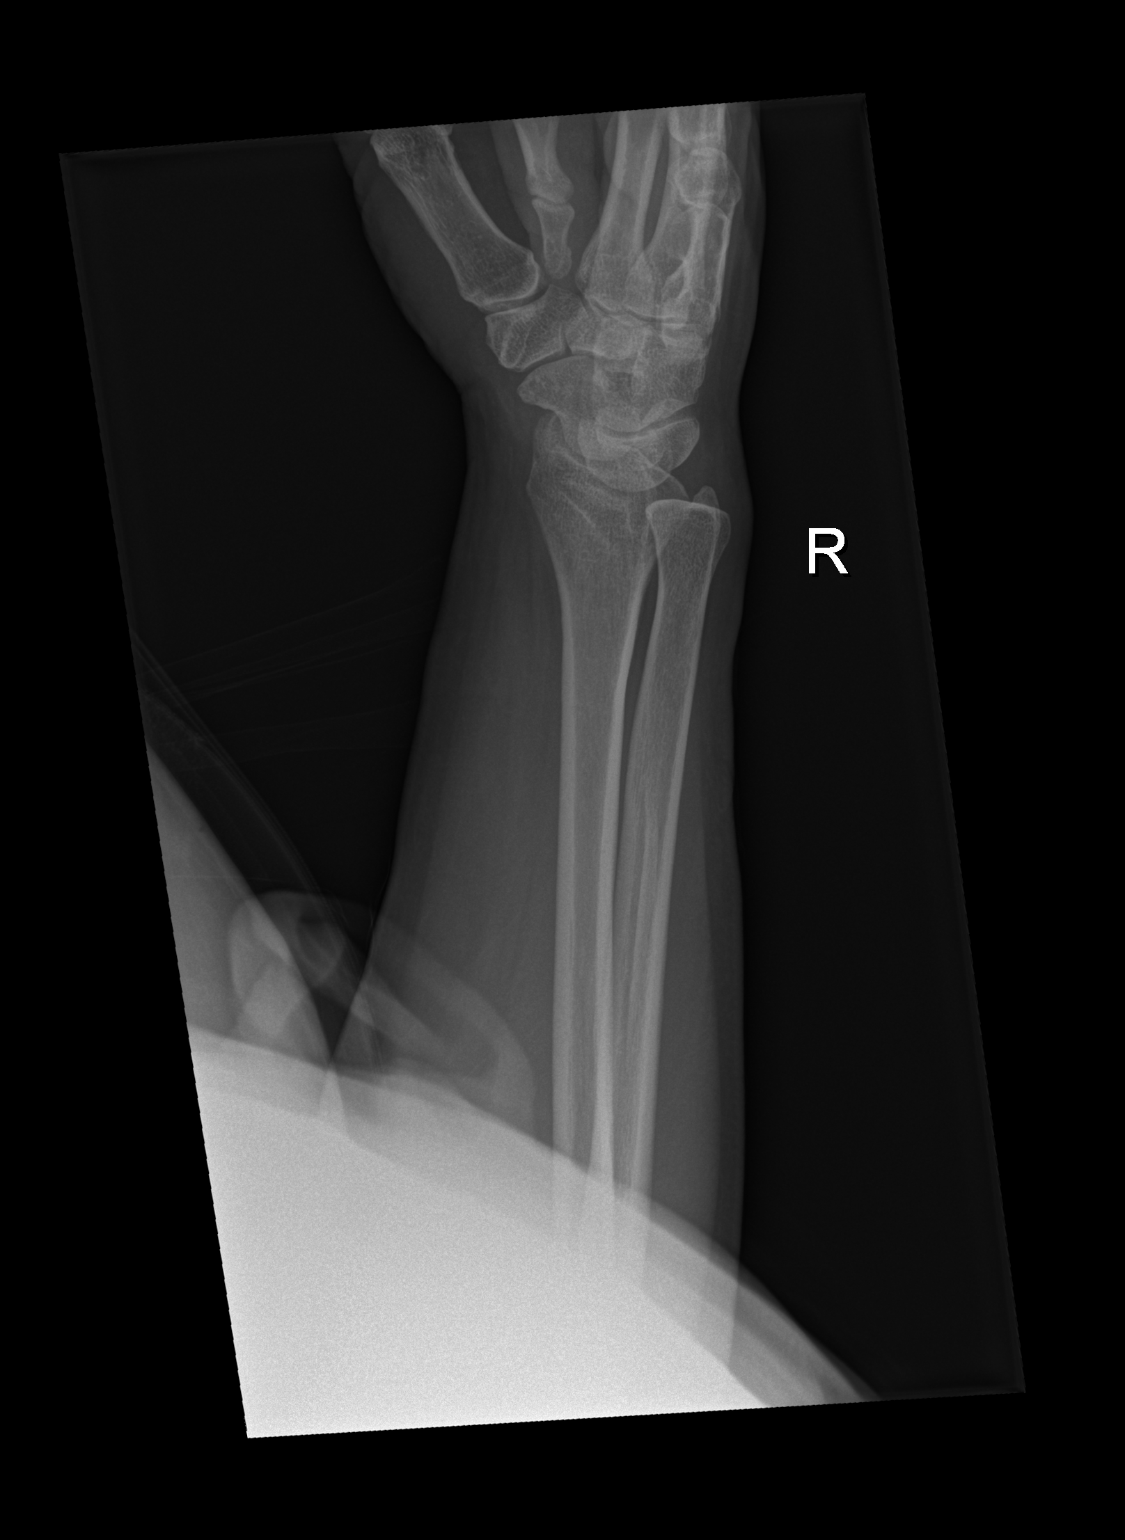

[x elbow ap right]
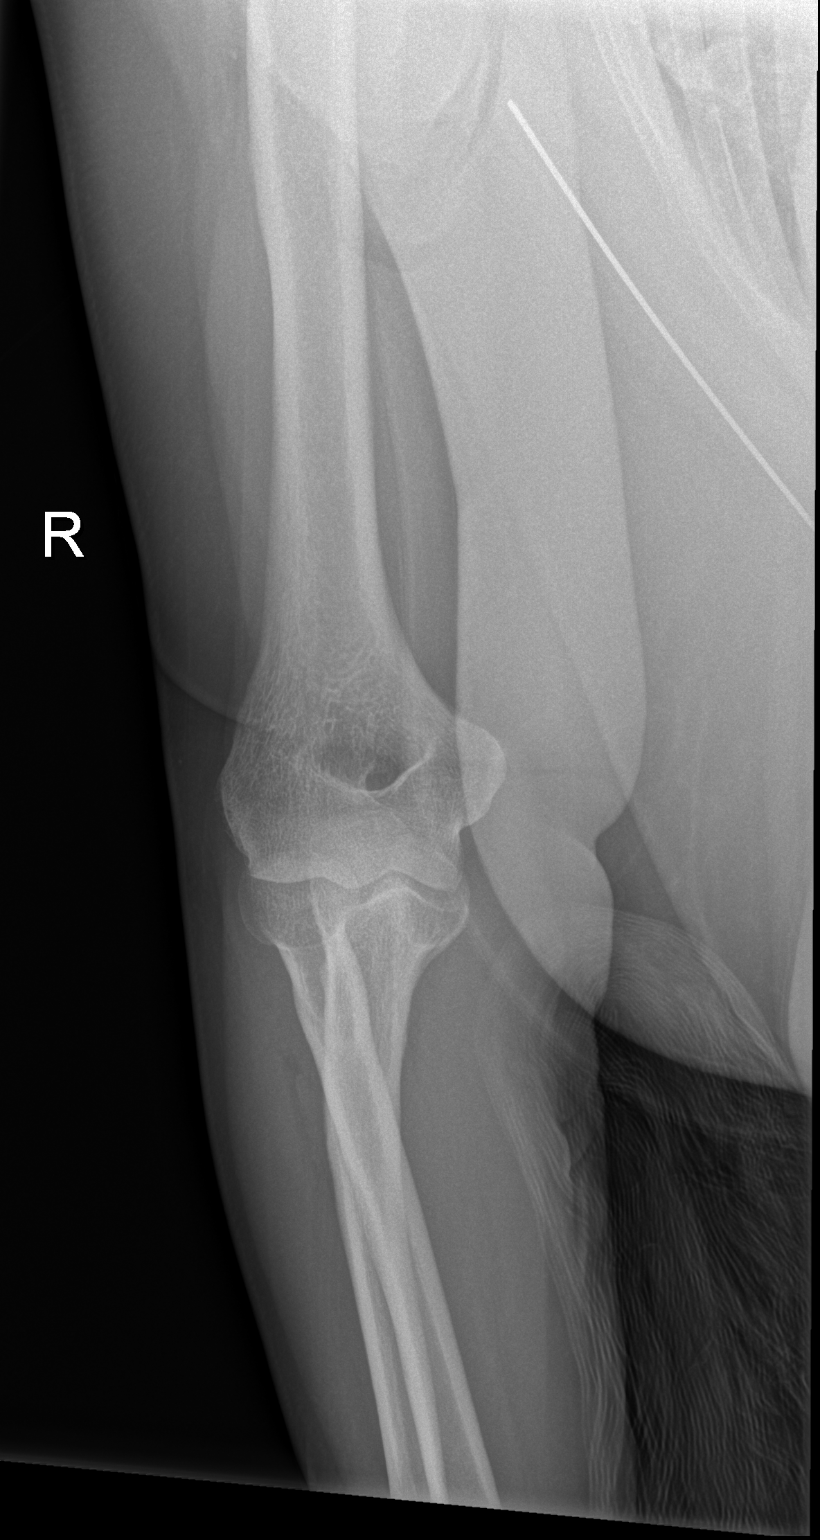

[3 of 3 positions shown; findings below may reference images not displayed]

FINDINGS: There is no evidence of fracture or other focal bone lesions. Soft
tissues are unremarkable.
IMPRESSION: Negative.
